# Patient Record
Sex: Female | Born: 1955
Health system: Southern US, Community
[De-identification: ages and names within clinical notes are randomized; demographics above are authoritative.]

## PROBLEM LIST (undated history)

## (undated) DIAGNOSIS — H269 Unspecified cataract: Secondary | ICD-10-CM

## (undated) DIAGNOSIS — J45909 Unspecified asthma, uncomplicated: Secondary | ICD-10-CM

## (undated) DIAGNOSIS — T7840XA Allergy, unspecified, initial encounter: Secondary | ICD-10-CM

## (undated) DIAGNOSIS — F32A Depression, unspecified: Secondary | ICD-10-CM

## (undated) DIAGNOSIS — F329 Major depressive disorder, single episode, unspecified: Secondary | ICD-10-CM

## (undated) HISTORY — PX: KNEE SURGERY: SHX244

## (undated) HISTORY — DX: Unspecified asthma, uncomplicated: J45.909

## (undated) HISTORY — DX: Unspecified cataract: H26.9

## (undated) HISTORY — DX: Allergy, unspecified, initial encounter: T78.40XA

## (undated) HISTORY — DX: Depression, unspecified: F32.A

## (undated) HISTORY — PX: CATARACT EXTRACTION: SUR2

## (undated) HISTORY — DX: Major depressive disorder, single episode, unspecified: F32.9

## (undated) HISTORY — PX: APPENDECTOMY: SHX54

---

## 1997-11-07 ENCOUNTER — Encounter: Admission: RE | Admit: 1997-11-07 | Discharge: 1998-02-05 | Payer: Self-pay | Admitting: Family Medicine

## 1998-06-28 ENCOUNTER — Other Ambulatory Visit: Admission: RE | Admit: 1998-06-28 | Discharge: 1998-06-28 | Payer: Self-pay | Admitting: Gynecology

## 1999-07-02 ENCOUNTER — Other Ambulatory Visit: Admission: RE | Admit: 1999-07-02 | Discharge: 1999-07-02 | Payer: Self-pay | Admitting: Gynecology

## 2000-06-21 ENCOUNTER — Other Ambulatory Visit: Admission: RE | Admit: 2000-06-21 | Discharge: 2000-06-21 | Payer: Self-pay | Admitting: Gynecology

## 2001-06-20 ENCOUNTER — Other Ambulatory Visit: Admission: RE | Admit: 2001-06-20 | Discharge: 2001-06-20 | Payer: Self-pay | Admitting: Gynecology

## 2002-06-22 ENCOUNTER — Other Ambulatory Visit: Admission: RE | Admit: 2002-06-22 | Discharge: 2002-06-22 | Payer: Self-pay | Admitting: Gynecology

## 2002-10-17 ENCOUNTER — Encounter: Admission: RE | Admit: 2002-10-17 | Discharge: 2002-10-17 | Payer: Self-pay | Admitting: Family Medicine

## 2002-10-17 ENCOUNTER — Encounter: Payer: Self-pay | Admitting: Family Medicine

## 2002-12-23 ENCOUNTER — Observation Stay (HOSPITAL_COMMUNITY): Admission: EM | Admit: 2002-12-23 | Discharge: 2002-12-24 | Payer: Self-pay | Admitting: Emergency Medicine

## 2002-12-23 ENCOUNTER — Encounter (INDEPENDENT_AMBULATORY_CARE_PROVIDER_SITE_OTHER): Payer: Self-pay | Admitting: Specialist

## 2003-06-25 ENCOUNTER — Ambulatory Visit (HOSPITAL_COMMUNITY): Admission: RE | Admit: 2003-06-25 | Discharge: 2003-06-25 | Payer: Self-pay | Admitting: Orthopedic Surgery

## 2003-09-11 ENCOUNTER — Other Ambulatory Visit: Admission: RE | Admit: 2003-09-11 | Discharge: 2003-09-11 | Payer: Self-pay | Admitting: Gynecology

## 2004-01-28 ENCOUNTER — Encounter: Admission: RE | Admit: 2004-01-28 | Discharge: 2004-01-28 | Payer: Self-pay | Admitting: Family Medicine

## 2004-07-29 ENCOUNTER — Encounter: Admission: RE | Admit: 2004-07-29 | Discharge: 2004-07-29 | Payer: Self-pay | Admitting: Family Medicine

## 2004-09-16 ENCOUNTER — Other Ambulatory Visit: Admission: RE | Admit: 2004-09-16 | Discharge: 2004-09-16 | Payer: Self-pay | Admitting: Gynecology

## 2005-04-29 ENCOUNTER — Ambulatory Visit (HOSPITAL_BASED_OUTPATIENT_CLINIC_OR_DEPARTMENT_OTHER): Admission: RE | Admit: 2005-04-29 | Discharge: 2005-04-29 | Payer: Self-pay | Admitting: Orthopedic Surgery

## 2005-09-17 ENCOUNTER — Other Ambulatory Visit: Admission: RE | Admit: 2005-09-17 | Discharge: 2005-09-17 | Payer: Self-pay | Admitting: Gynecology

## 2005-12-31 ENCOUNTER — Ambulatory Visit: Payer: Self-pay | Admitting: Internal Medicine

## 2006-01-05 ENCOUNTER — Ambulatory Visit (HOSPITAL_COMMUNITY): Admission: RE | Admit: 2006-01-05 | Discharge: 2006-01-05 | Payer: Self-pay | Admitting: Internal Medicine

## 2006-11-04 ENCOUNTER — Other Ambulatory Visit: Admission: RE | Admit: 2006-11-04 | Discharge: 2006-11-04 | Payer: Self-pay | Admitting: Gynecology

## 2006-12-08 ENCOUNTER — Ambulatory Visit: Payer: Self-pay | Admitting: Pulmonary Disease

## 2006-12-22 ENCOUNTER — Ambulatory Visit: Payer: Self-pay | Admitting: Internal Medicine

## 2007-11-08 ENCOUNTER — Other Ambulatory Visit: Admission: RE | Admit: 2007-11-08 | Discharge: 2007-11-08 | Payer: Self-pay | Admitting: Gynecology

## 2008-08-09 ENCOUNTER — Encounter: Admission: RE | Admit: 2008-08-09 | Discharge: 2008-08-09 | Payer: Self-pay | Admitting: Orthopedic Surgery

## 2008-08-23 ENCOUNTER — Encounter (INDEPENDENT_AMBULATORY_CARE_PROVIDER_SITE_OTHER): Payer: Self-pay | Admitting: Orthopedic Surgery

## 2008-08-23 ENCOUNTER — Ambulatory Visit: Admission: RE | Admit: 2008-08-23 | Discharge: 2008-08-23 | Payer: Self-pay | Admitting: Orthopedic Surgery

## 2008-08-23 ENCOUNTER — Ambulatory Visit: Payer: Self-pay | Admitting: Surgery

## 2010-07-01 NOTE — Assessment & Plan Note (Signed)
[facility]                             PULMONARY OFFICE NOTE   Cheryl Castillo, Cheryl Castillo                       MRN:          [id number]  DATE:12/22/2006                            DOB:          [birth year]    HISTORY OF PRESENT ILLNESS:  The patient is a 55 year old white female  patient of Dr. Roxy Cedar who has a known history of recurrent bronchitis  and allergic rhinitis.  Presents today for a 2 week followup.  Last  visit patient had been having difficulty with possible mold exposure.  Patient's house had been found to have extensive mold secondary to leaky  pipes.  Patient complains of recurrent headaches, nasal congestion,  postnasal drip symptoms and recurrent bronchitic exacerbations.  Last  visit patient was recommended to increase Flonase up to 2 puffs twice  daily, add-in Zyrtec at bedtime and along with a nasal hygiene regimen  with saline nasal spray.  Chest x-ray was done without any acute  findings noted.  Patient had been recommended to decrease her exposure  to her house if possible.  Patient returns today reporting that she has  moved out of her house and symptoms have substantially improved.  Cough  has totally resolved.  Nasal symptoms have decreased and her headache is  also resolved.  Patient does report though she has been back to her  house on 2 separate occasions and within a few minutes after returning  to the house her cough returns.  Patient reports she does feel much  better and overall feels much better.   PAST MEDICAL HISTORY:  Reviewed.   CURRENT MEDICATIONS:  Reviewed.   PHYSICAL EXAMINATION:  Patient is a pleasant female in no acute  distress.  She is afebrile with stable vital signs.  O2 saturation is  98% on room air.  HEENT:  Nasal mucosa is pale.  Nontender sinuses.  Posterior pharynx is  clear.  NECK:  Supple without cervical adenopathy.  LUNGS:  Sounds are clear without any wheezing or crackles.  CARDIAC:  Regular  rate and rhythm.  ABDOMEN:  Soft and nontender.  EXTREMITIES:  Warm without any edema.   IMPRESSION AND PLAN:  Recurrent bronchitis and rhinitis with recent  flare.  Symptoms are much improved with increased nasal regimen with  Flonase 2 puffs twice daily along with Zyrtec at bedtime.  Patient's  symptoms could also be secondary to chronic mold exposure in her home.  Patient is seeking alternative living situations presently and is  recommended to follow back up with our office as scheduled with Dr.  Maple Hudson in 6-8 weeks or sooner if needed.      Rubye Oaks, NP  Electronically Signed      Clinton D. Maple Hudson, MD, Tonny Bollman, FACP  Electronically Signed   TP/MedQ  DD: 12/24/2006  DT: 12/24/2006  Job #: [id number]

## 2010-07-01 NOTE — Assessment & Plan Note (Signed)
[facility]                             PULMONARY OFFICE NOTE   Cheryl, Castillo                       MRN:          [id number]  DATE:12/08/2006                            DOB:          1955-11-25    HISTORY OF PRESENT ILLNESS:  The patient is a very pleasant 55 year old  white female patient of Dr. Roxy Cedar who has a known history of recurrent  bronchitis and allergic rhinitis that presents today complaining that  she has been having difficulty over the last year since she built a new  house and was found to have pipe leaking and subsequent mold  development.  The patient has had extensive trouble trying to get the  mold cleaned out of her house and has noticed that she has been having  recurrent headaches, nasal congestion, postnasal drip symptoms, and  recurrent bronchitis.  The patient denies any purulent sputum, chest  pain, orthopnea, PND, or leg swelling.  The patient reports she has  episodes in which she has had her house completely cleaned of mold, and  symptoms did improve, however, noticed that over the last few months  that symptoms have seemed to return and had her house checked once  again, and the mold has redeveloped.  Since then, her symptoms have been  much worse.   PAST MEDICAL HISTORY:  Reviewed.   CURRENT MEDICATIONS:  Reviewed.   PHYSICAL EXAMINATION:  The patient is a pleasant female patient in no  acute distress.  She is afebrile with stable vital signs.  O2 saturation is 97% on room  air.  HEENT:  Nasal mucosa is pale, nontender sinuses. TMs are normal.  NECK:  Supple without cervical adenopathy.  No JVD.  LUNG SOUNDS:  Coarse breath sounds without any wheezing or crackles.  CARDIAC:  Regular rate.  ABDOMEN:  Soft and nontender.  EXTREMITIES:  Warm without any edema.   IMPRESSION AND PLAN:  Recurrent bronchitis and rhinitis with recent  flare, possibly secondary to chronic mold exposure.  The patient has  definitely noticed a difference in the relation to her exposure to mold  with symptomatology.  Have suggested that the patient may benefit from  alternative living arrangement until her house is completely rid of all  mold.  I recommend that she maximize her present therapy with Flonase  nasal spray 2 puffs twice daily  plus saline nasal spray.  Will add in Zyrtec 10 mg at bedtime, and she  will follow back up with Dr. Maple Hudson in 2 weeks or sooner if needed.  Chest x-ray is pending at time of dictation.      Rubye Oaks, NP  Electronically Signed      Clinton D. Maple Hudson, MD, Tonny Bollman, FACP  Electronically Signed   TP/MedQ  DD: 12/09/2006  DT: 12/10/2006  Job #: [id number]

## 2010-07-04 NOTE — Op Note (Signed)
NAMEZANITA, MILLMAN                ACCOUNT NO.:  [id number]   MEDICAL RECORD NO.:  [id number]          PATIENT TYPE:  AMB   LOCATION:  DSC                          FACILITY:  [facility]   PHYSICIAN:  Feliberto Gottron. Turner Daniels, M.D.   DATE OF BIRTH:  1955-09-16   DATE OF PROCEDURE:  04/29/2005  DATE OF DISCHARGE:  04/29/2005                                 OPERATIVE REPORT   PREOPERATIVE DIAGNOSES:  Left knee medial meniscal tear.   POSTOPERATIVE DIAGNOSES:  Left knee medial meniscal tear.  Chondromalacia of  medial femoral condyle and patella.   OPERATION PERFORMED:  Left knee partial arthroscopic medial meniscectomy for  a large parrot beak tear as well as debridement of chondromalacia with flap  tears on the medial femoral condyle and the patella.   SURGEON:  Feliberto Gottron. Turner Daniels, M.D.   ASSISTANT:  None.   ANESTHESIA:  General LMA.   ESTIMATED BLOOD LOSS:  Minimal.   FLUIDS REPLACED:  700 mL crystalloids.   DRAINS:  None.   TOURNIQUET TIME:  None.   INDICATIONS FOR PROCEDURE:  The patient is a 55 year old woman with  significant recurrent intermittent catching, popping and pain to the medial  aspect of her left knee.  She has failed conservative treatment with  observation and anti-inflammatory medicines and has had locking episodes  lasting up to 15 minutes that totally prevented her from walking.  Because  of this she is a candidate for arthroscopic evaluation and treatment of her  left knee.  A preoperative MRI scan was accomplished on April 23, 2005  showing a large complex tear of the posterior horn of the medial meniscus at  least 2 cm in length.  The risks and benefits of surgery were discussed and  she was prepared for surgical intervention.   DESCRIPTION OF PROCEDURE:  The patient was identified by arm band and taken  to the operating room at [facility] where the appropriate  anesthetic monitors were attached.  General LMA anesthesia was induced with  the patient in  the supine position.  Lateral post applied to the table and  the left lower extremity prepped and draped in the usual sterile fashion  from the ankle to the midthigh.  Using a #11 blade, standard inferomedial  and inferolateral peripatellar portals were made allowing of the arthroscope  through the inferolateral portal and outflow through the inferomedial  portal.  Diagnostic arthroscopy revealed some chondromalacia of the apex of  the patella which was debrided back to a stable margin with a 3.5 Gator  sucker shaver as well as the medial femoral condyle with grade 3  chondromalacia and flap tears again debrided back to a stable margin. A  large posterior parrot beak tear of the medial meniscus was identified.  It  was pulled forward and then resected with a 4.2 [place] sucker shaver  and a 3.5 Gator sucker shaver.  We then probed the meniscal remnant, most of  the posterior horn was now gone and it was felt to be stable.  At this point  the ACL and PCL were  visualized and found to be stable as was the lateral  meniscus.  The gutters were cleared medially and laterally.  The knee was  irrigated out with normal saline solution.  The arthroscopic instruments  were removed and a dressing of Xeroform, 4 x 4 dressing sponges, Webril and  Ace wrap was applied.  The patient was awakened and taken to the recovery  room without difficulty.      Feliberto Gottron. Turner Daniels, M.D.  Electronically Signed     FJR/MEDQ  D:  06/10/2005  T:  06/11/2005  Job:  [id number]

## 2010-07-04 NOTE — H&P (Signed)
NAME:  Cheryl Castillo, OPFER NO.:  [id number]   MEDICAL RECORD NO.:  [id number]                   PATIENT TYPE:  EMS   LOCATION:  ED                                   FACILITY:  [facility]   PHYSICIAN:  Lorre Munroe., M.D.            DATE OF BIRTH:  July 31, 1955   DATE OF ADMISSION:  12/23/2002  DATE OF DISCHARGE:                                HISTORY & PHYSICAL   CHIEF COMPLAINT:  Abdominal pain.   PRESENT ILLNESS:  The patient is a 55 year old white female who has had a  several day history of abdominal upset and generalized discomfort which has  changes into rather severe pain in the right lower quadrant.  She has not  had a fever or loss of appetite.  She has not had diarrhea or vomiting.  She  was seen at the emergency department where some tenderness of the abdomen  was noted.  White count was found to be slightly elevated and a CT scan of  the abdomen was performed which was consistent with acute appendicitis.  The  patient is admitted to the hospital to have an appendectomy.   PAST MEDICAL HISTORY:  1. She has gastroesophageal reflux disease.  2. She has a history of mood swings and is on Effexor, Wellbutrin, birth     control pills and acid reducing medicines.  3. She has a chronic cough.  4. She was recently treated with an antibiotic for an upper respiratory     infection.  5. She denies medication allergies.  6. She is not pregnant.  7. She has had a laparoscopy for endometriosis in an attempt to increase     fertility.  No other operations.   FAMILY HISTORY:  Unremarkable.   CHILDHOOD ILLNESSES:  Unremarkable.   She says she does not smoke or drink.   REVIEW OF SYSTEMS:  Unremarkable in detail except for what is mentioned  above.   PHYSICAL EXAMINATION:  VITAL SIGNS:  Temperature and vital signs per nursing  staff are unremarkable.  MENTAL STATUS:  Normal.  GENERAL:  She appears healthy and in no acute distress.  HEAD/NECK/EYES/EARS/NOSE/MOUTH AND THROAT:  Unremarkable.  CHEST:  Clear to auscultation.  HEART:  Rate and rhythm normal.  No murmur or gallop.  BREASTS:  Normal.  ABDOMEN:  Soft but quite tender in the right lower quadrant.  Bowel sounds  are present.  No masses are noted.  RECTAL:  Not done.  PELVIC:  Not done.  EXTREMITIES:  No edema.  Pulses good.  SKIN:  No lesions noted.  LYMPH NODES:  None enlarged.  NEUROLOGIC:  Normal.   IMPRESSION:  Acute appendicitis.   PLAN:  Laparoscopic appendectomy.  The patient agrees to this with the risk  of having anesthesia, possible bleeding, possible postoperative infection  and the slight possibility that the diagnosis is incorrect.  We will proceed  with appendectomy tonight.  Lorre Munroe., M.D.    WB/MEDQ  D:  12/23/2002  T:  12/23/2002  Job:  [id number]

## 2010-07-04 NOTE — Assessment & Plan Note (Signed)
[facility]                               PULMONARY OFFICE NOTE   Cheryl Castillo, Cheryl Castillo                       MRN:          [id number]  DATE:12/31/2005                            DOB:          June 11, 1955    PROBLEMS:  1. Bronchitis.  2. Rhinitis.  3. Possible reflux or aspiration.   HISTORY:  This is a never smoker previously followed at [facility] where she was last seen in 2003. She had a pattern of recurrent  bronchitis with colds primarily. Pulmonary function tests had been normal in  2002. She had been treated for acute bronchitis and for allergic rhinitis.  She says over the last few years she has continued to have episodes of  rhinitis and bronchitis symptoms. She has no pets. They live in a brand new  house that is about 57 years old now for them with a high grade air purifying  system. She does notice on questioning that symptoms cleared dramatically  when they went to [place] for vacation, but seemed to have gradually  returned after she had been back here for a while. She says she is coughing  all night, every night. She had evaluation by Dr. Narda Bonds and by an  allergist, with no help on many medications, saying that she has lots of  samples she has tried. A nettypot helps with saline lavage. She says she  evaluated her own problems by searching the Internet and thinks the muscles  in her throat are not working right. She says she frequently notices some  aspiration and choking with solids and liquids. While supine, she says  because of post-nasal, she will swallow a lot of air and often her head is  congested at night. Antibiotics repeatedly help. Levaquin caused joints to  ache. An albuterol inhaler helps. She describes a sub-sternal burning  sensation as being the source of her cough. She is using a Breathe-Right  strip almost every night.   MEDICATIONS:  1. Effexor 75 mg.  2. Wellbutrin.  3. Protonix.  4.  Albuterol inhaler.   ALLERGIES:  None. Except LEVAQUIN caused arthralgias.   OBJECTIVE:  Weight 172 pounds, blood pressure 108/60, pulse regular 84, room  air saturation 100%. There is some white mucoid post-nasal drainage. Maybe  minimal nasal stuffiness. No conjunctival injection. Pharynx does not look  reddened. There is no strider. Lungs were clear. Heart sounds regular  without murmur.   IMPRESSION:  1. Response to albuterol and to antibiotics suggest a bronchitis or      sinusitis component.  2. Her awareness that cough is triggered by eating and drinking suggests a      reflux or aspiration component.  3. Improvement when she went out of town suggests an environmental factor,      probably allergy.   PLAN:  1. Retry sample Astelin once each nostril at least nightly P.R.N.  2. Modified barium swallow, speech therapy assisted.  3. She made need a routine barium swallow to assess for reflux.  4. Try Symbicort 160/4.5 2 puffs b.i.d. rinsing mouth.  5. Schedule return one month, earlier P.R.N.     Clinton D. Maple Hudson, MD, Tonny Bollman, FACP  Electronically Signed    CDY/MedQ  DD: 12/31/2005  DT: 01/01/2006  Job #: [id number]   cc:   Talmadge Coventry, M.D.

## 2010-07-04 NOTE — Op Note (Signed)
   NAME:  Cheryl Castillo, Cheryl Castillo NO.:  [id number]   MEDICAL RECORD NO.:  [id number]                   PATIENT TYPE:  INP   LOCATION:  0101                                 FACILITY:  [facility]   PHYSICIAN:  Lorre Munroe., M.D.            DATE OF BIRTH:  December 07, 1955   DATE OF PROCEDURE:  12/23/2002  DATE OF DISCHARGE:                                 OPERATIVE REPORT   PREOPERATIVE DIAGNOSIS:  Acute appendicitis.   POSTOPERATIVE DIAGNOSIS:  Acute appendicitis.   OPERATION:  Laparoscopic appendectomy.   SURGEON:  Zigmund Daniel, M.D.   ANESTHESIA:  General.   DESCRIPTION OF PROCEDURE:  After adequate monitoring and anesthesia and  routine preparation and draping of the abdomen and placement of a Foley  catheter, I infused local anesthetic at the site of her previous laparoscopy  just below the umbilicus.  I subsequently anesthetized two additional sites  for other ports.  I made a transverse skin incision through an old  laparoscopy site and dissected down to the midline fascia, opened it in the  midline and bluntly entered the peritoneal cavity.  I dilated the tract with  my finger and felt around to be sure there were no adhesions of bowel to the  umbilicus.  I then used a 0 Vicryl pursestring suture in the fascia to  secure a Hasson cannula and inflated the abdomen with CO2.  I put in the  camera and then put in a 5 mm right upper quadrant port, retracted the cecum  up and saw an inflamed appendix.  I put in an 11 mm left lower quadrant  port, elevated the appendix, took down adhesions, and then dissected in the  mesentery until I saw the appendiceal artery.  I clipped it with clips and  divided it, and hemostasis was good.  I then amputated the appendix at its  entry into the cecum with the cutting stapler.  Hemostasis was excellent.  There was no free fluid.  There was no evidence of any  other inflammatory  disease in the pelvis.  I placed the  appendix in a plastic pouch and removed  it through the umbilical incision and tied the pursestring suture.  I  removed the right upper quadrant under direct vision and then removed the  left lower quadrant port after allowing the CO2 to escape.  The sponge,  needle, and instrument counts were correct were correct.  The patient  tolerated the operation well.                                               Lorre Munroe., M.D.    WB/MEDQ  D:  12/23/2002  T:  12/24/2002  Job:  [id number]

## 2010-07-29 ENCOUNTER — Other Ambulatory Visit: Payer: Self-pay | Admitting: Gastroenterology

## 2010-07-29 DIAGNOSIS — R1011 Right upper quadrant pain: Secondary | ICD-10-CM

## 2010-07-30 ENCOUNTER — Ambulatory Visit
Admission: RE | Admit: 2010-07-30 | Discharge: 2010-07-30 | Disposition: A | Discharge status: Home / Self Care | Payer: Commercial Managed Care - PPO | Source: Ambulatory Visit | Attending: Gastroenterology | Admitting: Gastroenterology

## 2010-07-30 DIAGNOSIS — R1011 Right upper quadrant pain: Secondary | ICD-10-CM

## 2011-01-14 ENCOUNTER — Telehealth: Payer: Self-pay | Admitting: Internal Medicine

## 2011-01-14 NOTE — Telephone Encounter (Signed)
Pt notified of CDY recs and says she will contact her PCP if she does not continue to improve. Pt verbalized understanding.

## 2011-01-14 NOTE — Telephone Encounter (Signed)
It can take a couple of weeks to recover from pneumonia, but that depends on lot of things. We can re-establish her here if she wants, to be available, but otherwise she should talk to her primary MD.

## 2011-01-14 NOTE — Telephone Encounter (Signed)
Called and spoke with pt.  Pt states she was recently dx with PNA 10 days ago at [facility].  States she was prescribed Ventolin and Omnicef 300mg  bid which she is taking.  Pt states she is in bed and resting.  Denies f/c/s but states she is very weak, no energy, tightness in chest, coughing up small amounts of yellow sputum.  Pt just wants  to know if this is the normal course for recovery from PNA, how long will this last, and is there anything she can do to expedite the recovery. FYI:  Pt hasn't seen CY since 12/2006!!!!!

## 2011-01-21 ENCOUNTER — Ambulatory Visit (INDEPENDENT_AMBULATORY_CARE_PROVIDER_SITE_OTHER): Payer: Commercial Managed Care - PPO

## 2011-01-21 DIAGNOSIS — J984 Other disorders of lung: Secondary | ICD-10-CM

## 2011-01-21 DIAGNOSIS — J157 Pneumonia due to Mycoplasma pneumoniae: Secondary | ICD-10-CM

## 2011-02-05 ENCOUNTER — Ambulatory Visit (INDEPENDENT_AMBULATORY_CARE_PROVIDER_SITE_OTHER): Payer: Commercial Managed Care - PPO

## 2011-02-05 DIAGNOSIS — J159 Unspecified bacterial pneumonia: Secondary | ICD-10-CM

## 2011-02-05 DIAGNOSIS — R05 Cough: Secondary | ICD-10-CM

## 2011-02-05 DIAGNOSIS — R059 Cough, unspecified: Secondary | ICD-10-CM

## 2011-12-03 ENCOUNTER — Ambulatory Visit (INDEPENDENT_AMBULATORY_CARE_PROVIDER_SITE_OTHER): Payer: Commercial Managed Care - PPO | Admitting: Family Medicine

## 2011-12-03 ENCOUNTER — Encounter: Payer: Self-pay | Admitting: Family Medicine

## 2011-12-03 VITALS — BP 110/64 | HR 61 | Temp 98.1°F | Resp 16 | Ht 63.0 in | Wt 150.2 lb

## 2011-12-03 DIAGNOSIS — Z8659 Personal history of other mental and behavioral disorders: Secondary | ICD-10-CM | POA: Insufficient documentation

## 2011-12-03 DIAGNOSIS — Z23 Encounter for immunization: Secondary | ICD-10-CM

## 2011-12-03 DIAGNOSIS — Z13220 Encounter for screening for lipoid disorders: Secondary | ICD-10-CM

## 2011-12-03 DIAGNOSIS — Z8371 Family history of colonic polyps: Secondary | ICD-10-CM | POA: Insufficient documentation

## 2011-12-03 DIAGNOSIS — J45909 Unspecified asthma, uncomplicated: Secondary | ICD-10-CM

## 2011-12-03 DIAGNOSIS — K219 Gastro-esophageal reflux disease without esophagitis: Secondary | ICD-10-CM | POA: Insufficient documentation

## 2011-12-03 DIAGNOSIS — Z8701 Personal history of pneumonia (recurrent): Secondary | ICD-10-CM | POA: Insufficient documentation

## 2011-12-03 DIAGNOSIS — Z8719 Personal history of other diseases of the digestive system: Secondary | ICD-10-CM | POA: Insufficient documentation

## 2011-12-03 DIAGNOSIS — G4733 Obstructive sleep apnea (adult) (pediatric): Secondary | ICD-10-CM | POA: Insufficient documentation

## 2011-12-03 DIAGNOSIS — Z0000 Encounter for general adult medical examination without abnormal findings: Secondary | ICD-10-CM

## 2011-12-03 LAB — POCT URINALYSIS DIPSTICK
Bilirubin, UA: NEGATIVE
Glucose, UA: NEGATIVE
Ketones, UA: NEGATIVE
Leukocytes, UA: NEGATIVE
Nitrite, UA: NEGATIVE
Protein, UA: NEGATIVE
Spec Grav, UA: 1.015
Urobilinogen, UA: 0.2
pH, UA: 7.5

## 2011-12-03 LAB — CBC WITH DIFFERENTIAL/PLATELET
Basophils Absolute: 0 10*3/uL (ref 0.0–0.1)
Basophils Relative: 1 % (ref 0–1)
Eosinophils Absolute: 0.1 10*3/uL (ref 0.0–0.7)
Eosinophils Relative: 2 % (ref 0–5)
HCT: 37.4 % (ref 36.0–46.0)
Hemoglobin: 13.2 g/dL (ref 12.0–15.0)
Lymphocytes Relative: 35 % (ref 12–46)
Lymphs Abs: 1.9 10*3/uL (ref 0.7–4.0)
MCH: 29.6 pg (ref 26.0–34.0)
MCHC: 35.3 g/dL (ref 30.0–36.0)
MCV: 83.9 fL (ref 78.0–100.0)
Monocytes Absolute: 0.4 10*3/uL (ref 0.1–1.0)
Monocytes Relative: 7 % (ref 3–12)
Neutro Abs: 3.1 10*3/uL (ref 1.7–7.7)
Neutrophils Relative %: 55 % (ref 43–77)
Platelets: 307 10*3/uL (ref 150–400)
RBC: 4.46 MIL/uL (ref 3.87–5.11)
RDW: 13.2 % (ref 11.5–15.5)
WBC: 5.6 10*3/uL (ref 4.0–10.5)

## 2011-12-03 LAB — COMPREHENSIVE METABOLIC PANEL
ALT: 21 U/L (ref 0–35)
AST: 18 U/L (ref 0–37)
Albumin: 4.6 g/dL (ref 3.5–5.2)
Alkaline Phosphatase: 62 U/L (ref 39–117)
BUN: 16 mg/dL (ref 6–23)
CO2: 30 mEq/L (ref 19–32)
Calcium: 9.9 mg/dL (ref 8.4–10.5)
Chloride: 105 mEq/L (ref 96–112)
Creat: 0.84 mg/dL (ref 0.50–1.10)
Glucose, Bld: 88 mg/dL (ref 70–99)
Potassium: 4.3 mEq/L (ref 3.5–5.3)
Sodium: 142 mEq/L (ref 135–145)
Total Bilirubin: 0.6 mg/dL (ref 0.3–1.2)
Total Protein: 6.9 g/dL (ref 6.0–8.3)

## 2011-12-03 LAB — LIPID PANEL
Cholesterol: 194 mg/dL (ref 0–200)
HDL: 58 mg/dL (ref >39)
LDL Cholesterol: 123 mg/dL — ABNORMAL HIGH (ref 0–99)
Total CHOL/HDL Ratio: 3.3 Ratio
Triglycerides: 66 mg/dL (ref <150)
VLDL: 13 mg/dL (ref 0–40)

## 2011-12-03 MED ORDER — MOMETASONE FUROATE 220 MCG/INH IN AEPB: 2.0000 | INHALATION_SPRAY | Freq: Every day | RESPIRATORY_TRACT | Status: DC

## 2011-12-03 MED ORDER — ALBUTEROL SULFATE HFA 108 (90 BASE) MCG/ACT IN AERS: 2.0000 | INHALATION_SPRAY | Freq: Four times a day (QID) | RESPIRATORY_TRACT | Status: DC | PRN

## 2011-12-03 NOTE — Patient Instructions (Signed)
Asthma, Adult Asthma is caused by narrowing of the air passages in the lungs. It may be triggered by pollen, dust, animal dander, molds, some foods, respiratory infections, exposure to smoke, exercise, emotional stress or other allergens (things that cause allergic reactions or allergies). Repeat attacks are common. HOME CARE INSTRUCTIONS   Use prescription medications as ordered by your caregiver.  Avoid pollen, dust, animal dander, molds, smoke and other things that cause attacks at home and at work.  You may have fewer attacks if you decrease dust in your home. Electrostatic air cleaners may help.  It may help to replace your pillows or mattress with materials less likely to cause allergies.  Talk to your caregiver about an action plan for managing asthma attacks at home, including, the use of a peak flow meter which measures the severity of your asthma attack. An action plan can help minimize or stop the attack without having to seek medical care.  If you are not on a fluid restriction, drink 8 to 10 glasses of water each day.  Always have a plan prepared for seeking medical attention, including, calling your physician, accessing local emergency care, and calling 911 (in the [place] for a severe attack.  Discuss possible exercise routines with your caregiver.  If animal dander is the cause of asthma, you may need to get rid of pets. SEEK MEDICAL CARE IF:   You have wheezing and shortness of breath even if taking medicine to prevent attacks.  You have muscle aches, chest pain or thickening of sputum.  Your sputum changes from clear or white to yellow, green, gray, or bloody.  You have any problems that may be related to the medicine you are taking (such as a rash, itching, swelling or trouble breathing). SEEK IMMEDIATE MEDICAL CARE IF:   Your usual medicines do not stop your wheezing or there is increased coughing and/or shortness of breath.  You have increased difficulty  breathing.  You have a fever. MAKE SURE YOU:   Understand these instructions.  Will watch your condition.  Will get help right away if you are not doing well or get worse. Document Released: 02/02/2005 Document Revised: 04/27/2011 Document Reviewed: 09/21/2007 [facility] Patient Information 2013 [place], [place].   Allergic Rhinitis Allergic rhinitis is when the mucous membranes in the nose respond to allergens. Allergens are particles in the air that cause your body to have an allergic reaction. This causes you to release allergic antibodies. Through a chain of events, these eventually cause you to release histamine into the blood stream (hence the use of antihistamines). Although meant to be protective to the body, it is this release that causes your discomfort, such as frequent sneezing, congestion and an itchy runny nose.  CAUSES  The pollen allergens may come from grasses, trees, and weeds. This is seasonal allergic rhinitis, or "hay fever." Other allergens cause year-round allergic rhinitis (perennial allergic rhinitis) such as house dust mite allergen, pet dander and mold spores.  SYMPTOMS   Nasal stuffiness (congestion).  Runny, itchy nose with sneezing and tearing of the eyes.  There is often an itching of the mouth, eyes and ears. It cannot be cured, but it can be controlled with medications. DIAGNOSIS  If you are unable to determine the offending allergen, skin or blood testing may find it. TREATMENT   Avoid the allergen.  Medications and allergy shots (immunotherapy) can help.  Hay fever may often be treated with antihistamines in pill or nasal spray forms. Antihistamines block the effects  of histamine. There are over-the-counter medicines that may help with nasal congestion and swelling around the eyes. Check with your caregiver before taking or giving this medicine. If the treatment above does not work, there are many new medications your caregiver can prescribe.  Stronger medications may be used if initial measures are ineffective. Desensitizing injections can be used if medications and avoidance fails. Desensitization is when a patient is given ongoing shots until the body becomes less sensitive to the allergen. Make sure you follow up with your caregiver if problems continue. SEEK MEDICAL CARE IF:   You develop fever (more than 100.5 F (38.1 C).  You develop a cough that does not stop easily (persistent).  You have shortness of breath.  You start wheezing.  Symptoms interfere with normal daily activities. Document Released: 10/28/2000 Document Revised: 04/27/2011 Document Reviewed: 05/09/2008 [facility] Patient Information 2013 [place], [place].    You have two inhalers to help reduce or eliminate your Asthma symptoms. At the first signs of tightness, cough or shortness of breath, use the Albuterol as needed ;when you feel that the Albuterol is not working by itself, add Asmanex and use it daily until respiratory symptoms disappear.

## 2011-12-03 NOTE — Progress Notes (Signed)
Subjective:    Patient ID: Cheryl Castillo, female    DOB: September 19, 1955, 56 y.o.   MRN: [id number]  HPI   This 56 y.o. Cauc female is here for CPE (PAP scheduled for November with Dr. Chevis Pretty). She has a hx of Allergic Asthma and Rhinitis, most strongly linked to mold discovered in her home 4-5 years ago. Her son also has similar allergy symptoms. She experiences mild nonprod cough And tightness in chest with burning in throat. She has several MDIs (Albuterol, Symbicort, Advair and Asmanex) but not sure when to use what. She seems to have most symptoms in the fall; she is a cyclist and exercises outdoors with no problems in spring, summer and winter. Pt reports having an evaluation by Dr. Mia Creek in 2009 with serum tests confirming mold allergy. Skin testing by an Allergist was negative. She had pneumonia last fall and was supposed to return for CXR in January or February but did not.  Pt attends OA ([employer]) which has helped her lose and keep off 30 lbs. 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  Her husband is a Risk analyst who is retired.  Last DEXA: 03/28/2009 (low probability of major fracture) Blood Type: A+ Lab results from testing done by Dr. Ananias Pilgrim: Elevated IgG concentrations for Penicillium notatum                                                                            and  Aspergillus flavus but modified RAST assay -Negative   Review of Systems  Constitutional: Positive for fatigue.  HENT: Negative.   Eyes: Negative.   Respiratory: Positive for apnea.   Cardiovascular: Negative.   Genitourinary: Negative.   Musculoskeletal: Negative.   Skin: Negative.   Neurological: Negative.   Hematological: Negative.   Psychiatric/Behavioral: Negative.        Objective:   Physical Exam  Nursing note and vitals reviewed. Constitutional: She is oriented to person, place, and time. She appears well-developed and well-nourished. No  distress.  HENT:  Head: Normocephalic and atraumatic.  Right Ear: Hearing, tympanic membrane, external ear and ear canal normal.  Left Ear: Hearing, tympanic membrane, external ear and ear canal normal.  Nose: Nose normal. No mucosal edema, nasal deformity or septal deviation. Right sinus exhibits no maxillary sinus tenderness and no frontal sinus tenderness. Left sinus exhibits no maxillary sinus tenderness and no frontal sinus tenderness.  Mouth/Throat: Uvula is midline and mucous membranes are normal. No oral lesions. Normal dentition. No dental caries. Posterior oropharyngeal erythema present. No oropharyngeal exudate.  Eyes: Conjunctivae normal, EOM and lids are normal. Pupils are equal, round, and reactive to light. No scleral icterus.  Fundoscopic exam:      The right eye shows no arteriolar narrowing and no papilledema. The right eye shows red reflex.      The left eye shows no arteriolar narrowing and no papilledema. The left eye shows red reflex. Neck: Normal range of motion. Neck supple. No thyromegaly present.  Cardiovascular: Normal rate, regular rhythm, normal heart sounds and intact distal pulses.  Exam reveals no gallop and no friction rub.   No murmur heard. Pulmonary/Chest: Effort normal and breath sounds normal. No respiratory distress.  She has no wheezes.  Abdominal: Soft. She exhibits no distension, no abdominal bruit, no pulsatile midline mass and no mass. Bowel sounds are decreased. There is no hepatosplenomegaly. There is no tenderness. There is no rebound, no guarding and no CVA tenderness. No hernia.  Genitourinary:       Deferred to GYN  Musculoskeletal: Normal range of motion. She exhibits no edema and no tenderness.  Lymphadenopathy:    She has no cervical adenopathy.  Neurological: She is alert and oriented to person, place, and time. She has normal reflexes. No cranial nerve deficit. She exhibits normal muscle tone. Coordination normal.  Skin: Skin is warm and  dry.       Multiple hyperpigmented areas on trunk esp. back area (no suspicious lesions)  Psychiatric: She has a normal mood and affect. Her behavior is normal. Judgment and thought content normal.    Spirometry (in office): normal.     Assessment & Plan:   1. Routine general medical examination at a health care facility  Comprehensive metabolic panel Spirometry with graph  2. Allergic rhinitis with asthma without status asthmaticus - per pt hx, this is only a problem during seasonal change in the fall.  Pt denies problems with routine exercise. CBC with Differential, Spirometry with graph Medication use reviewed: When Respiratory symptoms occur, use Albuterol MDI prn; if symptoms persists, add Asmanex and use daily (during fall season when usually symptomatic)  3. Screening for hyperlipidemia  Lipid panel  4. Need for prophylactic vaccination and inoculation against influenza  Flu vaccine greater than or equal to 3yo preservative free IM  5. Need for prophylactic vaccination against Streptococcus pneumoniae (pneumococcus)  Pneumococcal polysaccharide vaccine 23-valent greater than or equal to 2yo subcutaneous/IM

## 2011-12-04 ENCOUNTER — Encounter: Payer: Self-pay | Admitting: Family Medicine

## 2011-12-04 NOTE — Progress Notes (Signed)
Quick Note:  Please advise pt that the following labs are normal... Lipids are normal but LDL ("bad") cholesterol is above 100; with continued Healthy nutrition and regular exercise, this should come down over time. Complete blood counts are normal. Metabolic panel (which includes blood sugar, kidney and liver function tests and electrolytes) are all normal .  Continued good health to you!  Copy to pt. ______

## 2011-12-09 ENCOUNTER — Encounter: Payer: Self-pay | Admitting: Family Medicine

## 2012-02-01 ENCOUNTER — Other Ambulatory Visit: Payer: Self-pay | Admitting: Family Medicine

## 2012-02-01 ENCOUNTER — Other Ambulatory Visit: Payer: Self-pay | Admitting: Radiology

## 2012-04-07 ENCOUNTER — Ambulatory Visit: Payer: 59 | Admitting: Family Medicine

## 2012-11-25 ENCOUNTER — Ambulatory Visit (INDEPENDENT_AMBULATORY_CARE_PROVIDER_SITE_OTHER): Payer: BC Managed Care – PPO | Admitting: *Deleted

## 2012-11-25 DIAGNOSIS — Z23 Encounter for immunization: Secondary | ICD-10-CM

## 2013-05-08 ENCOUNTER — Ambulatory Visit (INDEPENDENT_AMBULATORY_CARE_PROVIDER_SITE_OTHER): Payer: BC Managed Care – PPO | Admitting: Internal Medicine

## 2013-05-08 VITALS — BP 101/54 | HR 67 | Temp 98.4°F | Resp 18 | Wt 160.0 lb

## 2013-05-08 DIAGNOSIS — Z780 Asymptomatic menopausal state: Secondary | ICD-10-CM | POA: Insufficient documentation

## 2013-05-08 DIAGNOSIS — R454 Irritability and anger: Secondary | ICD-10-CM

## 2013-05-08 DIAGNOSIS — M899 Disorder of bone, unspecified: Secondary | ICD-10-CM

## 2013-05-08 DIAGNOSIS — M8580 Other specified disorders of bone density and structure, unspecified site: Secondary | ICD-10-CM | POA: Insufficient documentation

## 2013-05-08 DIAGNOSIS — N951 Menopausal and female climacteric states: Secondary | ICD-10-CM

## 2013-05-08 DIAGNOSIS — G4733 Obstructive sleep apnea (adult) (pediatric): Secondary | ICD-10-CM

## 2013-05-08 DIAGNOSIS — M949 Disorder of cartilage, unspecified: Secondary | ICD-10-CM
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
## 2013-05-09 NOTE — Patient Instructions (Signed)
See me as needed 

## 2013-06-06 ENCOUNTER — Telehealth: Payer: Self-pay | Admitting: *Deleted

## 2013-06-06 NOTE — Telephone Encounter (Signed)
Pt had a free thyroid scan that was abnormal will send scan to office and would like to know if Dr Coralyn Mark wants to see her or if she will need a referral

## 2013-06-15 ENCOUNTER — Ambulatory Visit: Payer: Commercial Managed Care - PPO | Admitting: Internal Medicine

## 2013-07-04 ENCOUNTER — Ambulatory Visit (HOSPITAL_BASED_OUTPATIENT_CLINIC_OR_DEPARTMENT_OTHER)
Admission: RE | Admit: 2013-07-04 | Discharge: 2013-07-04 | Disposition: A | Discharge status: Home / Self Care | Payer: BC Managed Care – PPO | Source: Ambulatory Visit | Attending: Internal Medicine | Admitting: Internal Medicine

## 2013-07-04 ENCOUNTER — Encounter: Payer: Self-pay | Admitting: Internal Medicine

## 2013-07-04 ENCOUNTER — Ambulatory Visit (INDEPENDENT_AMBULATORY_CARE_PROVIDER_SITE_OTHER): Payer: BC Managed Care – PPO | Admitting: Internal Medicine

## 2013-07-04 VITALS — BP 93/60 | HR 61 | Temp 98.0°F | Resp 18 | Ht 63.5 in | Wt 158.0 lb

## 2013-07-04 DIAGNOSIS — R946 Abnormal results of thyroid function studies: Secondary | ICD-10-CM

## 2013-07-04 DIAGNOSIS — R9389 Abnormal findings on diagnostic imaging of other specified body structures: Secondary | ICD-10-CM

## 2013-07-04 DIAGNOSIS — Z139 Encounter for screening, unspecified: Secondary | ICD-10-CM

## 2013-07-04 DIAGNOSIS — Z13220 Encounter for screening for lipoid disorders: Secondary | ICD-10-CM

## 2013-07-04 LAB — CBC WITH DIFFERENTIAL/PLATELET
Basophils Absolute: 0.1 10*3/uL (ref 0.0–0.1)
Basophils Relative: 1 % (ref 0–1)
Eosinophils Absolute: 0.2 10*3/uL (ref 0.0–0.7)
Eosinophils Relative: 3 % (ref 0–5)
HCT: 36.3 % (ref 36.0–46.0)
Hemoglobin: 13 g/dL (ref 12.0–15.0)
Lymphocytes Relative: 39 % (ref 12–46)
Lymphs Abs: 2 10*3/uL (ref 0.7–4.0)
MCH: 30.5 pg (ref 26.0–34.0)
MCHC: 35.8 g/dL (ref 30.0–36.0)
MCV: 85.2 fL (ref 78.0–100.0)
Monocytes Absolute: 0.3 10*3/uL (ref 0.1–1.0)
Monocytes Relative: 6 % (ref 3–12)
Neutro Abs: 2.6 10*3/uL (ref 1.7–7.7)
Neutrophils Relative %: 51 % (ref 43–77)
Platelets: 306 10*3/uL (ref 150–400)
RBC: 4.26 MIL/uL (ref 3.87–5.11)
RDW: 13.1 % (ref 11.5–15.5)
WBC: 5 10*3/uL (ref 4.0–10.5)

## 2013-07-04 LAB — COMPREHENSIVE METABOLIC PANEL
ALT: 18 U/L (ref 0–35)
AST: 20 U/L (ref 0–37)
Albumin: 4.2 g/dL (ref 3.5–5.2)
Alkaline Phosphatase: 49 U/L (ref 39–117)
BUN: 18 mg/dL (ref 6–23)
CO2: 30 mEq/L (ref 19–32)
Calcium: 9.4 mg/dL (ref 8.4–10.5)
Chloride: 107 mEq/L (ref 96–112)
Creat: 0.8 mg/dL (ref 0.50–1.10)
Glucose, Bld: 98 mg/dL (ref 70–99)
Potassium: 4.2 mEq/L (ref 3.5–5.3)
Sodium: 141 mEq/L (ref 135–145)
Total Bilirubin: 0.4 mg/dL (ref 0.2–1.2)
Total Protein: 6.6 g/dL (ref 6.0–8.3)

## 2013-07-04 LAB — TSH: TSH: 2.163 u[IU]/mL (ref 0.350–4.500)

## 2013-07-04 LAB — LIPID PANEL
Cholesterol: 180 mg/dL (ref 0–200)
HDL: 59 mg/dL (ref >39)
LDL Cholesterol: 109 mg/dL — ABNORMAL HIGH (ref 0–99)
Total CHOL/HDL Ratio: 3.1 Ratio
Triglycerides: 60 mg/dL (ref <150)
VLDL: 12 mg/dL (ref 0–40)

## 2013-07-04 NOTE — Patient Instructions (Addendum)
Document me as PCP on pts chart  Schedule CPE   To lab and xray today

## 2013-07-04 NOTE — Progress Notes (Signed)
Subjective:    Patient ID: Cheryl Castillo, female    DOB: 01-10-1956, 58 y.o.   MRN: [id number]  HPI Cheryl Castillo is here for follow up.  She wishes to change to me as PCP.  She went to a screening [place] fair and brings copy of NMR lipoprofile and thyroid screening ultrasound.  U/S indicates possible nodule in left thryoid lobe.   No mention if cystic or solid or size.    LDL 94 ,  HDL 45   TG's   140  Total  171   Particle number  [id number]   nonfasting  Allergies  Allergen Reactions  . Levaquin [Levofloxacin]     Joint Pain   Past Medical History  Diagnosis Date  . Allergy   . Cataract   . Asthma   . Depression    Past Surgical History  Procedure Laterality Date  . Cataract extraction    . Appendectomy    . Knee surgery     History   Social History  . Marital Status: Married    Spouse Name: N/A    Number of Children: N/A  . Years of Education: N/A   Occupational History  . Not on file.   Social History Main Topics  . Smoking status: Never Smoker   . Smokeless tobacco: Never Used  . Alcohol Use:   . Drug Use: No  . Sexual Activity: Yes   Other Topics Concern  . Not on file   Social History Narrative  . No narrative on file   Family History  Problem Relation Age of Onset  . Diabetes Mother   . Arthritis Mother   . Cancer Father   . Cancer Brother     skin cancer   Patient Active Problem List   Diagnosis Date Noted  . Osteopenia 05/08/2013  . Menopause 05/08/2013  . Allergic rhinitis with asthma without status asthmaticus 12/03/2011  . Hx of bacterial pneumonia 12/03/2011  . History of IBS 12/03/2011  . GERD (gastroesophageal reflux disease) 12/03/2011  . Family history of colonic polyps 12/03/2011  . Severe obstructive sleep apnea 12/03/2011  . History of mood disorder 12/03/2011   Current Outpatient Prescriptions on File Prior to Visit  Medication Sig Dispense Refill  . albuterol (PROVENTIL HFA;VENTOLIN HFA) 108 (90 BASE) MCG/ACT inhaler Inhale 2  puffs into the lungs every 6 (six) hours as needed.  1 Inhaler  2  . cycloSPORINE (RESTASIS) 0.05 % ophthalmic emulsion 1 drop 2 (two) times daily.      . mometasone (ASMANEX 60 METERED DOSES) 220 MCG/INH inhaler Inhale 2 puffs into the lungs daily.  1 Inhaler  0  . PRESCRIPTION MEDICATION 0.5 patches. Combi Patch 50/140 change patch twice weekly       No current facility-administered medications on file prior to visit.       Review of Systems See HPI    Objective:   Physical Exam Physical Exam  Nursing note and vitals reviewed.  Constitutional: She is oriented to person, place, and time. She appears well-developed and well-nourished.  HENT:  Head: Normocephalic and atraumatic.  Neck  I do not feel any discrete thyroid nodule.  No thyromegaly  No cervical adenopathy Cardiovascular: Normal rate and regular rhythm. Exam reveals no gallop and no friction rub.  No murmur heard.  Pulmonary/Chest: Breath sounds normal. She has no wheezes. She has no rales.  Neurological: She is alert and oriented to person, place, and time.  Skin: Skin is warm and dry.  Psychiatric: She has a normal mood and affect. Her behavior is normal.         Assessment & Plan:  Question of thyroid nodule  Will get ultrasound today  Screening will get fasting lipid profile.    Schedule CPE

## 2013-07-05 ENCOUNTER — Telehealth: Payer: Self-pay | Admitting: Internal Medicine

## 2013-07-05 ENCOUNTER — Encounter: Payer: Self-pay | Admitting: Internal Medicine

## 2013-07-05 DIAGNOSIS — E041 Nontoxic single thyroid nodule: Secondary | ICD-10-CM

## 2013-07-05 LAB — VITAMIN D 25 HYDROXY (VIT D DEFICIENCY, FRACTURES): Vit D, 25-Hydroxy: 70 ng/mL (ref 30–89)

## 2013-07-05 NOTE — Telephone Encounter (Signed)
Spoke with pt and given ultrasound results  Will set up thyroid biopsy

## 2013-07-05 NOTE — Telephone Encounter (Signed)
Left message for pt to call office regarding ultrasound report

## 2013-07-06 ENCOUNTER — Encounter: Payer: Self-pay | Admitting: *Deleted

## 2013-07-13 ENCOUNTER — Ambulatory Visit
Admission: RE | Admit: 2013-07-13 | Discharge: 2013-07-13 | Disposition: A | Discharge status: Home / Self Care | Payer: BC Managed Care – PPO | Source: Ambulatory Visit | Attending: Internal Medicine | Admitting: Internal Medicine

## 2013-07-13 ENCOUNTER — Other Ambulatory Visit (HOSPITAL_COMMUNITY)
Admission: RE | Admit: 2013-07-13 | Discharge: 2013-07-13 | Disposition: A | Discharge status: Home / Self Care | Payer: BC Managed Care – PPO | Source: Ambulatory Visit | Attending: Interventional Radiology | Admitting: Interventional Radiology

## 2013-07-13 DIAGNOSIS — E041 Nontoxic single thyroid nodule: Secondary | ICD-10-CM

## 2013-07-17 ENCOUNTER — Telehealth: Payer: Self-pay | Admitting: Internal Medicine

## 2013-07-17 DIAGNOSIS — E041 Nontoxic single thyroid nodule: Secondary | ICD-10-CM

## 2013-07-17 NOTE — Telephone Encounter (Signed)
Spoke with pt and informed of hyperplastic nodule on imaging   Will refer to Endocrinology for assessment

## 2013-07-27 ENCOUNTER — Encounter: Payer: Self-pay | Admitting: Internal Medicine

## 2013-07-27 ENCOUNTER — Ambulatory Visit (INDEPENDENT_AMBULATORY_CARE_PROVIDER_SITE_OTHER): Payer: BC Managed Care – PPO | Admitting: Internal Medicine

## 2013-07-27 VITALS — BP 100/59 | HR 65 | Temp 98.5°F | Resp 18 | Ht 63.5 in | Wt 158.0 lb

## 2013-07-27 DIAGNOSIS — N951 Menopausal and female climacteric states: Secondary | ICD-10-CM

## 2013-07-27 DIAGNOSIS — J45909 Unspecified asthma, uncomplicated: Secondary | ICD-10-CM

## 2013-07-27 DIAGNOSIS — M949 Disorder of cartilage, unspecified: Secondary | ICD-10-CM

## 2013-07-27 DIAGNOSIS — Z23 Encounter for immunization: Secondary | ICD-10-CM

## 2013-07-27 DIAGNOSIS — Z8659 Personal history of other mental and behavioral disorders: Secondary | ICD-10-CM

## 2013-07-27 DIAGNOSIS — K219 Gastro-esophageal reflux disease without esophagitis: Secondary | ICD-10-CM

## 2013-07-27 DIAGNOSIS — Z0000 Encounter for general adult medical examination without abnormal findings: Secondary | ICD-10-CM

## 2013-07-27 DIAGNOSIS — Z8371 Family history of colonic polyps: Secondary | ICD-10-CM

## 2013-07-27 DIAGNOSIS — Z780 Asymptomatic menopausal state: Secondary | ICD-10-CM

## 2013-07-27 DIAGNOSIS — H269 Unspecified cataract: Secondary | ICD-10-CM | POA: Insufficient documentation

## 2013-07-27 DIAGNOSIS — M899 Disorder of bone, unspecified: Secondary | ICD-10-CM

## 2013-07-27 DIAGNOSIS — E041 Nontoxic single thyroid nodule: Secondary | ICD-10-CM

## 2013-07-27 DIAGNOSIS — G4733 Obstructive sleep apnea (adult) (pediatric): Secondary | ICD-10-CM

## 2013-07-27 DIAGNOSIS — M8580 Other specified disorders of bone density and structure, unspecified site: Secondary | ICD-10-CM

## 2013-07-27 NOTE — Patient Instructions (Signed)
See me as needed 

## 2013-07-27 NOTE — Progress Notes (Signed)
Subjective:    Patient ID: Cheryl Castillo, female    DOB: 1955-12-09, 58 y.o.   MRN: [id number]  HPI Cheryl Castillo is here for CPE.  She will be travelling to [place] to scuba dive in the [place] in 2 weeks.    HM  Pap, mm, and dexa done with her GYN  Dr.  Carren Rang  Osteopenia  On ca and vitamin D  Active yoga exerciser  OSA on Cpap  Allergic rhinitis/allergic asthma  Has not needed a rescue inhaler in several weeks.  Had mold growth in home several years back and bronchspasm worsened then.  House clear now   Thyroid nodule   Path shows hyperplastic nodule with microcalcifications.  She has appt with Dr. Cruzita Lederer soon.  Sh eis asymptomatic  Mood disorder no sadness or depression     Allergies  Allergen Reactions  . Levaquin [Levofloxacin]     Joint Pain   Past Medical History  Diagnosis Date  . Allergy   . Cataract   . Asthma   . Depression    Past Surgical History  Procedure Laterality Date  . Cataract extraction    . Appendectomy    . Knee surgery     History   Social History  . Marital Status: Married    Spouse Name: N/A    Number of Children: N/A  . Years of Education: N/A   Occupational History  . Not on file.   Social History Main Topics  . Smoking status: Never Smoker   . Smokeless tobacco: Never Used  . Alcohol Use:   . Drug Use: No  . Sexual Activity: Yes   Other Topics Concern  . Not on file   Social History Narrative  . No narrative on file   Family History  Problem Relation Age of Onset  . Diabetes Mother   . Arthritis Mother   . Cancer Father   . Cancer Brother     skin cancer   Patient Active Problem List   Diagnosis Date Noted  . Thyroid nodule  bx done 06/2013 07/05/2013  . Osteopenia 05/08/2013  . Menopause 05/08/2013  . Allergic rhinitis with asthma without status asthmaticus 12/03/2011  . Hx of bacterial pneumonia 12/03/2011  . History of IBS 12/03/2011  . GERD (gastroesophageal reflux disease) 12/03/2011  . Family history of colonic  polyps 12/03/2011  . Severe obstructive sleep apnea 12/03/2011  . History of mood disorder 12/03/2011   Current Outpatient Prescriptions on File Prior to Visit  Medication Sig Dispense Refill  . albuterol (PROVENTIL HFA;VENTOLIN HFA) 108 (90 BASE) MCG/ACT inhaler Inhale 2 puffs into the lungs every 6 (six) hours as needed.  1 Inhaler  2  . cycloSPORINE (RESTASIS) 0.05 % ophthalmic emulsion 1 drop 2 (two) times daily.      . mometasone (ASMANEX 60 METERED DOSES) 220 MCG/INH inhaler Inhale 2 puffs into the lungs daily.  1 Inhaler  0  . PRESCRIPTION MEDICATION 0.5 patches. Combi Patch 50/140 change patch twice weekly       No current facility-administered medications on file prior to visit.       Review of Systems  Respiratory: Negative for choking, chest tightness, shortness of breath, wheezing and stridor.   Cardiovascular: Negative for chest pain, palpitations and leg swelling.  Gastrointestinal: Negative for abdominal pain.  All other systems reviewed and are negative.      Objective:   Physical Exam  Physical Exam  Nursing note and vitals reviewed.  Constitutional: She is  oriented to person, place, and time. She appears well-developed and well-nourished.  HENT:  Head: Normocephalic and atraumatic.  Right Ear: Tympanic membrane and ear canal normal. No drainage. Tympanic membrane is not injected and not erythematous.  Left Ear: Tympanic membrane and ear canal normal. No drainage. Tympanic membrane is not injected and not erythematous.  Nose: Nose normal. Right sinus exhibits no maxillary sinus tenderness and no frontal sinus tenderness. Left sinus exhibits no maxillary sinus tenderness and no frontal sinus tenderness.  Mouth/Throat: Oropharynx is clear and moist. No oral lesions. No oropharyngeal exudate.  Eyes: Conjunctivae and EOM are normal. Pupils are equal, round, and reactive to light.  Neck: Normal range of motion. Neck supple. No JVD present. Carotid bruit is not  present. No mass and no thyromegaly present.  Cardiovascular: Normal rate, regular rhythm, S1 normal, S2 normal and intact distal pulses. Exam reveals no gallop and no friction rub.  No murmur heard.  Pulses:  Carotid pulses are 2+ on the right side, and 2+ on the left side.  Dorsalis pedis pulses are 2+ on the right side, and 2+ on the left side.  No carotid bruit. No LE edema  Pulmonary/Chest: Breath sounds normal. She has no wheezes. She has no rales. She exhibits no tenderness.  Breast no discrete mass no nipple discharge no axillary adenopathy bilaterally Abdominal: Soft. Bowel sounds are normal. She exhibits no distension and no mass. There is no hepatosplenomegaly. There is no tenderness. There is no CVA tenderness.  Musculoskeletal: Normal range of motion.  No active synovitis to joints.  Lymphadenopathy:  She has no cervical adenopathy.  She has no axillary adenopathy.  Right: No inguinal and no supraclavicular adenopathy present.  Left: No inguinal and no supraclavicular adenopathy present.  Neurological: She is alert and oriented to person, place, and time. She has normal strength and normal reflexes. She displays no tremor. No cranial nerve deficit or sensory deficit. Coordination and gait normal.  Skin: Skin is warm and dry. No rash noted. No cyanosis. Nails show no clubbing.  Psychiatric: She has a normal mood and affect. Her speech is normal and behavior is normal. Cognition and memory are normal.           Assessment & Plan:  HM  See above  Tdap given today  UTD with colonoscopy  Counseled lung ca screening guidelines  Pt is a nonsmoker  All rhintis /  Asthma  Inactive now  Rescue inhaler prn  Thyroid hyperplastic nodule with calcification  Upcoming appt with endocrine  Osteopenia on ca/vit D and exercise  OSA  On CPAP  Mood disorder  Off any meds now

## 2013-07-28 ENCOUNTER — Ambulatory Visit (INDEPENDENT_AMBULATORY_CARE_PROVIDER_SITE_OTHER): Payer: BC Managed Care – PPO | Admitting: Internal Medicine

## 2013-07-28 ENCOUNTER — Encounter: Payer: Self-pay | Admitting: Internal Medicine

## 2013-07-28 VITALS — BP 108/60 | HR 78 | Temp 98.8°F | Resp 12 | Ht 63.5 in | Wt 161.0 lb

## 2013-07-28 DIAGNOSIS — E041 Nontoxic single thyroid nodule: Secondary | ICD-10-CM

## 2013-07-28 NOTE — Patient Instructions (Signed)
Please return in 1 year.  We will need a thyroid ultrasound again at that time.

## 2013-07-28 NOTE — Progress Notes (Signed)
Patient ID: Cheryl Castillo, female   DOB: 1955-07-28, 58 y.o.   MRN: [id number]   HPI  Cheryl Castillo is a 58 y.o.-year-old female, referred by her PCP, Dr.Schoenhoff, for evaluation for thyroid nodules.  Patient was found to have thyroid nodules at the health fair screen. She subsequently saw her PCP order a thyroid ultrasound that showed 3 nodules.  Thyroid U/S (07/04/2013): 3 thyroid nodules:  Right thyroid lobe: 1.) Small 0.5 x 0.4 x 0.3 cm solid hypoechoic nodule in the mid aspect of the gland.  2.) Ovoid 1.1 x 1.0 x 0.6 cm solid isoechoic nodule in the inferior aspect of the gland.   Left thyroid lobe  1.) 1.2 x 0.9 x 0.7 cm circumscribed hypoechoic nodule with internal microcalcifications in the mid aspect of the gland.  Pt had a Bx of the L thyroid nodule (07/13/2013): benign: hyperplastic nodule  I reviewed pt's thyroid tests - TSH normal last month: Lab Results  Component Value Date   TSH 2.163 07/04/2013    Pt denies feeling nodules in neck, hoarseness, dysphagia/odynophagia, SOB with lying down.  Pt c/o: - + heat intolerance (hot flushes) - no tremors - no palpitations - no anxiety/depression - no hyperdefecation/+ constipation - no weight loss - + weight gain - no dry skin - no hair falling - no fatigue  Pt does not have a FH of thyroid ds. No FH of thyroid cancer. No h/o radiation tx to head or neck.  ROS: Constitutional: see history of present illness Eyes: no blurry vision, + xerophthalmia ENT: no sore throat, no nodules palpated in throat, no dysphagia/odynophagia, no hoarseness Cardiovascular: no CP/SOB/palpitations/leg swelling Respiratory: no cough/SOB Gastrointestinal: no N/V/D/+ C Musculoskeletal: no muscle/joint aches Skin: no rashes Neurological: no tremors/numbness/tingling/dizziness, + HAs Psychiatric: no depression/anxiety + Low libido  Past Medical History  Diagnosis Date  . Allergy   . Cataract   . Asthma   . Depression    Past  Surgical History  Procedure Laterality Date  . Cataract extraction    . Appendectomy    . Knee surgery     History   Social History  . Marital Status: Married    Spouse Name: N/A    Number of Children: 2   Occupational History  . artist   Social History Main Topics  . Smoking status: Never Smoker   . Smokeless tobacco: Never Used  . Alcohol Use:   . Drug Use: No   Current Outpatient Prescriptions on File Prior to Visit  Medication Sig Dispense Refill  . albuterol (PROVENTIL HFA;VENTOLIN HFA) 108 (90 BASE) MCG/ACT inhaler Inhale 2 puffs into the lungs every 6 (six) hours as needed.  1 Inhaler  2  . cycloSPORINE (RESTASIS) 0.05 % ophthalmic emulsion 1 drop 2 (two) times daily.      . mometasone (ASMANEX 60 METERED DOSES) 220 MCG/INH inhaler Inhale 2 puffs into the lungs daily.  1 Inhaler  0  . PRESCRIPTION MEDICATION 0.5 patches. Combi Patch 50/140 change patch twice weekly       No current facility-administered medications on file prior to visit.   Allergies  Allergen Reactions  . Levaquin [Levofloxacin]     Joint Pain   Family History  Problem Relation Age of Onset  . Diabetes Mother   . Arthritis Mother   . Cancer Father   . Cancer Brother     skin cancer   PE: BP 108/60  Pulse 78  Temp(Src) 98.8 F (37.1 C) (Oral)  Resp 12  Ht 5' 3.5" (1.613 m)  Wt 161 lb (73.029 kg)  BMI 28.07 kg/m2  SpO2 96% Wt Readings from Last 3 Encounters:  07/28/13 161 lb (73.029 kg)  07/27/13 158 lb (71.668 kg)  07/04/13 158 lb (71.668 kg)   Constitutional: overweight, in NAD Eyes: PERRLA, EOMI, no exophthalmos ENT: moist mucous membranes, no thyromegaly, no individual thyroid nodules palpated, no cervical lymphadenopathy Cardiovascular: RRR, No MRG Respiratory: CTA B Gastrointestinal: abdomen soft, NT, ND, BS+ Musculoskeletal: no deformities, strength intact in all 4;  Skin: moist, warm, no rashes Neurological: no tremor with outstretched hands, DTR 2/5 in all  4   ASSESSMENT: 1. Thyroid nodules - 3  Right thyroid lobe: 1.) Small 0.5 x 0.4 x 0.3 cm solid hypoechoic nodule in the mid aspect of the gland.  2.) Ovoid 1.1 x 1.0 x 0.6 cm solid isoechoic nodule in the inferior aspect of the gland.   Left thyroid lobe  1.) 1.2 x 0.9 x 0.7 cm circumscribed hypoechoic nodule with internal microcalcifications in the mid aspect of the gland.  PLAN: 1. Thyroid nodules  - I reviewed the images for the thyroid U/S and also the movie of her nodule FNA. I also reviewed the report of her thyroid ultrasound along with the patient. I pointed out that the dominant nodules are small and the one that was Bx'ed contained calcifications, which is a risk factor for cancer.  Pt does not have a thyroid cancer family history or a personal history of RxTx to head/neck. All these, along with a benign Bx would favor benignity.  - the fact that one of the nodules contains calcifications can be worrisome, however the fact that the biopsy was benign is reassuring. However, I discussed with the patient to obtain another ultrasound at a year from now and then possibly repeat the biopsy at that time. If the second biopsy of the left thyroid nodule is still benign, the chances of cancer I probably 0. - I also advised her to let me know if she develops neck compression symptoms before next visit. She denies any neck compression symptoms now. - I'll see her back in a year

## 2013-11-13 ENCOUNTER — Ambulatory Visit (INDEPENDENT_AMBULATORY_CARE_PROVIDER_SITE_OTHER): Payer: BC Managed Care – PPO | Admitting: *Deleted

## 2013-11-13 DIAGNOSIS — Z23 Encounter for immunization: Secondary | ICD-10-CM

## 2013-11-17 ENCOUNTER — Ambulatory Visit (INDEPENDENT_AMBULATORY_CARE_PROVIDER_SITE_OTHER): Payer: BC Managed Care – PPO | Admitting: Family Medicine

## 2013-11-17 ENCOUNTER — Encounter: Payer: Self-pay | Admitting: Family Medicine

## 2013-11-17 VITALS — BP 120/50 | HR 65 | Temp 98.6°F | Resp 16 | Ht 63.0 in | Wt 158.4 lb

## 2013-11-17 DIAGNOSIS — Z119 Encounter for screening for infectious and parasitic diseases, unspecified: Secondary | ICD-10-CM

## 2013-11-17 DIAGNOSIS — Z23 Encounter for immunization: Secondary | ICD-10-CM

## 2013-11-17 DIAGNOSIS — J4521 Mild intermittent asthma with (acute) exacerbation: Secondary | ICD-10-CM

## 2013-11-17 LAB — HEPATITIS B SURFACE ANTIBODY, QUANTITATIVE: Hepatitis B-Post: 0 m[IU]/mL

## 2013-11-17 LAB — HEPATITIS A ANTIBODY, IGM: Hep A IgM: NONREACTIVE

## 2013-11-17 MED ORDER — CIPROFLOXACIN HCL 500 MG PO TABS: 500.0000 mg | ORAL_TABLET | Freq: Two times a day (BID) | ORAL | Status: DC

## 2013-11-17 MED ORDER — BUDESONIDE-FORMOTEROL FUMARATE 160-4.5 MCG/ACT IN AERO: 2.0000 | INHALATION_SPRAY | Freq: Two times a day (BID) | RESPIRATORY_TRACT | Status: DC

## 2013-11-17 MED ORDER — ALBUTEROL SULFATE HFA 108 (90 BASE) MCG/ACT IN AERS: 2.0000 | INHALATION_SPRAY | Freq: Four times a day (QID) | RESPIRATORY_TRACT | Status: DC | PRN

## 2013-11-17 NOTE — Patient Instructions (Signed)
You are being vaccinated against Hep A and Hep B. We are checking you antibody status but results will not be available until next week.  You will need to return after your trip to [place] to finish the vaccination series (2 for Hep A and 3 for Hep B).  You also can get your prescription for Shingles and get that vaccine after you complete the hepatitis vaccine series.  We can discuss Prevnar later; the recommendation is to wait 1 year after Pneumovax to receive Prevnar13.  You can get this vaccine next year.

## 2013-11-21 NOTE — Progress Notes (Signed)
S:  This 58 y.o. Cauc female is traveling to [place] w/ her husband. She needs immunization review and requests RX: Cipro for treatment of "Traveler's diarrhea". They will be staying in [place] for 2 days in a hotel but [place]. Pt has no record of immunization for Hepatitis A or B.  She agrees to testing for immunity and beginning vaccination; she will not be in [place] next week.and flight to [place] is the following week.  Pt has Asthma; she has been using MDI samples and has a Asmanex sample as well as Symbicort. Most samples are out-of-date. Pt reports minimal symptoms, w/ exacerbation after strenuous exercise and seasonal weather changes. She uses ICS- LABA inhaler twice a week "if needed". Pt denies recent respiratory infection, fever/chills, sore throat, rhinorrhea, PND, cough or SOB.  Patient Active Problem List   Diagnosis Date Noted  . Cataract 07/27/2013  . Thyroid nodule  bx done 06/2013 07/05/2013  . Osteopenia 05/08/2013  . Menopause 05/08/2013  . Allergic rhinitis with asthma without status asthmaticus 12/03/2011  . Hx of bacterial pneumonia 12/03/2011  . History of IBS 12/03/2011  . GERD (gastroesophageal reflux disease) 12/03/2011  . Family history of colonic polyps 12/03/2011  . Severe obstructive sleep apnea 12/03/2011  . History of mood disorder 12/03/2011    Prior to Admission medications   Medication Sig Start Date End Date Taking? Authorizing Provider  albuterol (PROVENTIL HFA;VENTOLIN HFA) 108 (90 BASE) MCG/ACT inhaler Inhale 2 puffs into the lungs every 6 (six) hours as needed.   Yes Historical Provider, MD  cycloSPORINE (RESTASIS) 0.05 % ophthalmic emulsion 1 drop 2 (two) times daily.   Yes Historical Provider, MD  PRESCRIPTION MEDICATION 0.5 patches. Combi Patch 50/140 change patch twice weekly   Yes Historical Provider, MD  budesonide-formoterol (SYMBICORT) 160-4.5 MCG/ACT inhaler Inhale 2 puffs into the lungs 2 (two) times  daily.    Historical Provider, MD  Mometasone (ASMANEX 60 METERED DOSES) 220 MCG/INH inhaler Inhale 2 puffs into the lungs daily.    Historical Provider, MD    History   Social History  . Marital Status: Married    Spouse Name: N/A    Number of Children: N/A  . Years of Education: N/A   Occupational History  . Retired.   Social History Main Topics  . Smoking status: Never Smoker   . Smokeless tobacco: Never Used  . Alcohol Use:   . Drug Use: No  . Sexual Activity: Yes   Other Topics Concern  . Not on file   Social History Narrative  . No narrative on file    Family History  Problem Relation Age of Onset  . Diabetes Mother   . Arthritis Mother   . Cancer Father   . Cancer Brother     skin cancer    ROS: As per HPI.  O: Filed Vitals:   11/17/13 0937  BP: 120/50  Pulse: 65  Temp: 98.6 F (37 C)  Resp: 16    GEN: In NAD; WN,WD. HEENT: [place]/AT; EOMI w/ clear conj/sclerae. Ext ears/canals/TMs normal. Nose w/ normal mucosa and straight septum. Post ph w/ normal uvula; tongue and dentition normal. NECK: Supple w/o LAN. COR: RRR. Normal S1 and S2. LUNGS: CTA; normal resp rate and effort. No wheezes. SKIN: W&D; intact w/o erythema, rashes or pallor. NEURO: A&O x 3; CNs intact. Nonfocal.  A/P: Asthma with acute exacerbation, mild intermittent- Advised to wear mask while in [place] (pollution is severe). Use ICS-LABA  MDI daily.  Special screening examination for infectious diseases - Will test for immunity against Hep A and B. Plan: Hepatitis B surface antibody, Hepatitis A Antibody, IGM, Hepatitis A vaccine adult IM, Hepatitis B vaccine adult IM Pt will return for remainder of vaccination series after return from [place].   Meds ordered this encounter  Medications  . ciprofloxacin (CIPRO) 500 MG tablet    Sig: Take 1 tablet (500 mg total) by mouth 2 (two) times daily.    Dispense:  20 tablet    Refill:  0  . albuterol (PROVENTIL HFA;VENTOLIN HFA) 108 (90 BASE)  MCG/ACT inhaler    Sig: Inhale 2 puffs into the lungs every 6 (six) hours as needed.    Dispense:  1 Inhaler    Refill:  2  . budesonide-formoterol (SYMBICORT) 160-4.5 MCG/ACT inhaler    Sig: Inhale 2 puffs into the lungs 2 (two) times daily.    Dispense:  1 Inhaler    Refill:  3

## 2013-12-20 ENCOUNTER — Encounter: Payer: BC Managed Care – PPO | Admitting: Internal Medicine

## 2014-01-16 ENCOUNTER — Other Ambulatory Visit: Payer: Self-pay | Admitting: Dermatology

## 2014-01-31 ENCOUNTER — Telehealth: Payer: Self-pay | Admitting: *Deleted

## 2014-01-31 ENCOUNTER — Telehealth: Payer: Self-pay

## 2014-01-31 DIAGNOSIS — M25579 Pain in unspecified ankle and joints of unspecified foot: Secondary | ICD-10-CM

## 2014-01-31 MED ORDER — CELECOXIB 100 MG PO CAPS: 100.0000 mg | ORAL_CAPSULE | Freq: Two times a day (BID) | ORAL | Status: AC

## 2014-01-31 NOTE — Telephone Encounter (Signed)
Sent Celebrex in as requested. Called pharmacy to confirm generic is available and gave verbal directions "one daily prn". Called pt she was not happy that we would not call in 2 times daily and a 3 month supply. I stated to pt that she did not specify this in the phone calls before. I verified that she only takes Celebrex prn and she confirmed but still wanted 2 times daily for 3 month supply. [facility] they are the [facility] ones that will do 3 month supply. I explained to pt that I would let Dr. Coralyn Mark know her concerns. Pt expressed understanding.

## 2014-01-31 NOTE — Telephone Encounter (Signed)
Cheryl Castillo   Call pt and let her know that she never mentioned Celebrex to me at her visit with me it is not on her new pt form  Who prescribed this for her in the past and what is she taking it for ?  I Tell  Pt I am not sure there is a generic version of Celebrex

## 2014-01-31 NOTE — Telephone Encounter (Signed)
Cheryl Castillo [phone] [facility]  [facility] called to see if we could call in Generic Celebrex 200 mg BID, 90 day supply,

## 2014-01-31 NOTE — Telephone Encounter (Signed)
OK to call in Celebrex  100 mg #30 one daily prn  .  Let her know that I do not think they make a generic of this and insurance usually does not want to pay for this one  But ok to try to call in for her

## 2014-01-31 NOTE — Telephone Encounter (Signed)
Dr. Coralyn Mark,  I don't see where this pt has ever had Celebrex ordered. Can you check on this and let me know what you would like to do?  Thanks MH

## 2014-01-31 NOTE — Telephone Encounter (Signed)
Pt called in stating her husband is a podiatrist and she used to be able to get Celebrex samples from his office but no longer is able to do this. She takes Celebrex prn after strenuous exercise and would like to have some on hand as needed. Please advise if this can be sent in to pharm.  Thanks MH

## 2014-01-31 NOTE — Telephone Encounter (Signed)
LM for pt to rtn call need info on Celebrex.Marland KitchenMarland KitchenMarland Kitchen

## 2014-03-21 ENCOUNTER — Telehealth: Payer: Self-pay

## 2014-03-21 NOTE — Telephone Encounter (Signed)
Patient would like to know if she has been tested for Hep C   [phone]

## 2014-04-11 NOTE — Telephone Encounter (Signed)
Spoke with patient. She has only been tested for Hep A and Hep B. She is currently out of town but she will call back tomorrow and set up an appt for Hep C testing.

## 2014-05-08 ENCOUNTER — Other Ambulatory Visit: Payer: Self-pay | Admitting: Gynecology

## 2014-05-09 LAB — CYTOLOGY - PAP

## 2014-08-29 ENCOUNTER — Telehealth: Payer: Self-pay

## 2014-09-26 ENCOUNTER — Ambulatory Visit (INDEPENDENT_AMBULATORY_CARE_PROVIDER_SITE_OTHER): Payer: BLUE CROSS/BLUE SHIELD | Admitting: Emergency Medicine

## 2014-09-26 VITALS — BP 102/70 | HR 65 | Temp 98.7°F | Resp 16 | Ht 63.0 in | Wt 161.0 lb

## 2014-09-26 DIAGNOSIS — G4733 Obstructive sleep apnea (adult) (pediatric): Secondary | ICD-10-CM

## 2014-09-26 DIAGNOSIS — R5383 Other fatigue: Secondary | ICD-10-CM

## 2014-09-26 LAB — TSH: TSH: 1.646 u[IU]/mL (ref 0.350–4.500)

## 2014-09-26 LAB — CBC WITH DIFFERENTIAL/PLATELET
BASOS PCT: 1 % (ref 0–1)
Basophils Absolute: 0.1 10*3/uL (ref 0.0–0.1)
Eosinophils Absolute: 0.1 10*3/uL (ref 0.0–0.7)
Eosinophils Relative: 2 % (ref 0–5)
HCT: 35 % — ABNORMAL LOW (ref 36.0–46.0)
Hemoglobin: 12.5 g/dL (ref 12.0–15.0)
LYMPHS PCT: 36 % (ref 12–46)
Lymphs Abs: 2.4 10*3/uL (ref 0.7–4.0)
MCH: 30.3 pg (ref 26.0–34.0)
MCHC: 35.7 g/dL (ref 30.0–36.0)
MCV: 85 fL (ref 78.0–100.0)
MPV: 9.4 fL (ref 8.6–12.4)
Monocytes Absolute: 0.4 10*3/uL (ref 0.1–1.0)
Monocytes Relative: 6 % (ref 3–12)
NEUTROS ABS: 3.7 10*3/uL (ref 1.7–7.7)
NEUTROS PCT: 55 % (ref 43–77)
Platelets: 292 10*3/uL (ref 150–400)
RBC: 4.12 MIL/uL (ref 3.87–5.11)
RDW: 13.3 % (ref 11.5–15.5)
WBC: 6.7 10*3/uL (ref 4.0–10.5)

## 2014-09-26 LAB — COMPREHENSIVE METABOLIC PANEL
ALT: 18 U/L (ref 6–29)
AST: 19 U/L (ref 10–35)
Albumin: 4.4 g/dL (ref 3.6–5.1)
Alkaline Phosphatase: 46 U/L (ref 33–130)
BUN: 19 mg/dL (ref 7–25)
CALCIUM: 10 mg/dL (ref 8.6–10.4)
CHLORIDE: 104 mmol/L (ref 98–110)
CO2: 28 mmol/L (ref 20–31)
Creat: 0.95 mg/dL (ref 0.50–0.99)
Glucose, Bld: 96 mg/dL (ref 65–99)
POTASSIUM: 4.6 mmol/L (ref 3.5–5.3)
Sodium: 140 mmol/L (ref 135–146)
TOTAL PROTEIN: 6.6 g/dL (ref 6.1–8.1)
Total Bilirubin: 0.3 mg/dL (ref 0.2–1.2)

## 2014-09-26 MED ORDER — ALBUTEROL SULFATE HFA 108 (90 BASE) MCG/ACT IN AERS: 2.0000 | INHALATION_SPRAY | Freq: Four times a day (QID) | RESPIRATORY_TRACT | Status: DC | PRN

## 2014-09-26 MED ORDER — BUDESONIDE-FORMOTEROL FUMARATE 160-4.5 MCG/ACT IN AERO: 2.0000 | INHALATION_SPRAY | Freq: Two times a day (BID) | RESPIRATORY_TRACT | Status: AC

## 2014-09-26 NOTE — Progress Notes (Signed)
Subjective:  Patient ID: Cheryl Castillo, female    DOB: February 11, 1956  Age: 59 y.o. MRN: [id number]  CC: Fatigue; Generalized Body Aches; Otalgia; sinus pressure; immunizations; and Medication Refill   HPI Cheryl Castillo presents  with vague complaints of fatigue. She has some muscle aches and pressure in her ears and pressure sinus she denies any fever chills nausea vomiting no stool change. She has no cough or wheezing or shortness breath. She does have obstructive sleep apnea and has a six-year-old CPAP machine that she hasn't been performing maintenance on. She said she feels as she did before she started CPAP. He is getting ready to leave on a several week trip to [place] on Sunday and is concerned that maybe she needs a "Z-Pak" to take with her just in case she gets sick  History Cheryl Castillo has a past medical history of Allergy; Cataract; Asthma; and Depression.   She has past surgical history that includes Cataract extraction; Appendectomy; and Knee surgery.   Her  family history includes Arthritis in her mother; Cancer in her brother and father; Diabetes in her mother.  She   reports that she has never smoked. She has never used smokeless tobacco. She reports that she does not use illicit drugs. Her alcohol history is not on file.  Outpatient Prescriptions Prior to Visit  Medication Sig Dispense Refill  . celecoxib (CELEBREX) 100 MG capsule Take 1 capsule (100 mg total) by mouth 2 (two) times daily. 30 capsule 0  . cycloSPORINE (RESTASIS) 0.05 % ophthalmic emulsion 1 drop 2 (two) times daily.    Marland Kitchen PRESCRIPTION MEDICATION 0.5 patches. Combi Patch 50/140 change patch twice weekly    . albuterol (PROVENTIL HFA;VENTOLIN HFA) 108 (90 BASE) MCG/ACT inhaler Inhale 2 puffs into the lungs every 6 (six) hours as needed. 1 Inhaler 2  . budesonide-formoterol (SYMBICORT) 160-4.5 MCG/ACT inhaler Inhale 2 puffs into the lungs 2 (two) times daily. 1 Inhaler 3  . ciprofloxacin (CIPRO) 500 MG tablet Take  1 tablet (500 mg total) by mouth 2 (two) times daily. (Patient not taking: Reported on 09/26/2014) 20 tablet 0   No facility-administered medications prior to visit.    Social History   Social History  . Marital Status: Married    Spouse Name: N/A  . Number of Children: N/A  . Years of Education: N/A   Social History Main Topics  . Smoking status: Never Smoker   . Smokeless tobacco: Never Used  . Alcohol Use: None  . Drug Use: No  . Sexual Activity: Yes   Other Topics Concern  . None   Social History Narrative     Review of Systems  Constitutional: Negative for fever, chills and appetite change.  HENT: Negative for congestion, ear pain, postnasal drip, sinus pressure and sore throat.   Eyes: Negative for pain and redness.  Respiratory: Negative for cough, shortness of breath and wheezing.   Cardiovascular: Negative for leg swelling.  Gastrointestinal: Negative for nausea, vomiting, abdominal pain, diarrhea, constipation and blood in stool.  Endocrine: Negative for polyuria.  Genitourinary: Negative for dysuria, urgency, frequency and flank pain.  Musculoskeletal: Negative for gait problem.  Skin: Negative for rash.  Neurological: Negative for weakness and headaches.  Psychiatric/Behavioral: Negative for confusion and decreased concentration. The patient is not nervous/anxious.     Objective:  BP 102/70 mmHg  Pulse 65  Temp(Src) 98.7 F (37.1 C) (Oral)  Resp 16  Ht 5\' 3"  (1.6 m)  Wt 161 lb (73.029 kg)  BMI 28.53 kg/m2  SpO2 98%  Physical Exam  Constitutional: She is oriented to person, place, and time. She appears well-developed and well-nourished. No distress.  HENT:  Head: Normocephalic and atraumatic.  Right Ear: External ear normal.  Left Ear: External ear normal.  Nose: Nose normal.  Eyes: Conjunctivae and EOM are normal. Pupils are equal, round, and reactive to light. No scleral icterus.  Neck: Normal range of motion. Neck supple. No tracheal deviation  present.  Cardiovascular: Normal rate, regular rhythm and normal heart sounds.   Pulmonary/Chest: Effort normal. No respiratory distress. She has no wheezes. She has no rales.  Abdominal: She exhibits no mass. There is no tenderness. There is no rebound and no guarding.  Musculoskeletal: She exhibits no edema.  Lymphadenopathy:    She has no cervical adenopathy.  Neurological: She is alert and oriented to person, place, and time. Coordination normal.  Skin: Skin is warm and dry. No rash noted.  Psychiatric: She has a normal mood and affect. Her behavior is normal.      Assessment & Plan:   Cheryl Castillo was seen today for fatigue, generalized body aches, otalgia, sinus pressure, immunizations and medication refill.  Diagnoses and all orders for this visit:  Other fatigue -     Cancel: Hepatitis C Ab Reflex HCV RNA, QUANT -     Cancel: POCT CBC -     Comprehensive metabolic panel -     TSH -     Hepatitis C antibody -     CBC with Differential/Platelet  Obstructive sleep apnea -     Hepatitis C antibody -     CBC with Differential/Platelet  Other orders -     albuterol (PROVENTIL HFA;VENTOLIN HFA) 108 (90 BASE) MCG/ACT inhaler; Inhale 2 puffs into the lungs every 6 (six) hours as needed. -     budesonide-formoterol (SYMBICORT) 160-4.5 MCG/ACT inhaler; Inhale 2 puffs into the lungs 2 (two) times daily.   I am having Cheryl Castillo maintain her PRESCRIPTION MEDICATION, cycloSPORINE, ciprofloxacin, celecoxib, albuterol, and budesonide-formoterol.  Meds ordered this encounter  Medications  . albuterol (PROVENTIL HFA;VENTOLIN HFA) 108 (90 BASE) MCG/ACT inhaler    Sig: Inhale 2 puffs into the lungs every 6 (six) hours as needed.    Dispense:  1 Inhaler    Refill:  2  . budesonide-formoterol (SYMBICORT) 160-4.5 MCG/ACT inhaler    Sig: Inhale 2 puffs into the lungs 2 (two) times daily.    Dispense:  1 Inhaler    Refill:  3    Appropriate red flag conditions were discussed with the  patient as well as actions that should be taken.  Patient expressed his understanding.  Follow-up: Return if symptoms worsen or fail to improve.  Roselee Culver, MD

## 2014-09-26 NOTE — Patient Instructions (Signed)
Sleep Apnea  Sleep apnea is a sleep disorder characterized by abnormal pauses in breathing while you sleep. When your breathing pauses, the level of oxygen in your blood decreases. This causes you to move out of deep sleep and into light sleep. As a result, your quality of sleep is poor, and the system that carries your blood throughout your body (cardiovascular system) experiences stress. If sleep apnea remains untreated, the following conditions can develop:  High blood pressure (hypertension).  Coronary artery disease.  Inability to achieve or maintain an erection (impotence).  Impairment of your thought process (cognitive dysfunction). There are three types of sleep apnea: 1. Obstructive sleep apnea--Pauses in breathing during sleep because of a blocked airway. 2. Central sleep apnea--Pauses in breathing during sleep because the area of the brain that controls your breathing does not send the correct signals to the muscles that control breathing. 3. Mixed sleep apnea--A combination of both obstructive and central sleep apnea. RISK FACTORS The following risk factors can increase your risk of developing sleep apnea:  Being overweight.  Smoking.  Having narrow passages in your nose and throat.  Being of older age.  Being female.  Alcohol use.  Sedative and tranquilizer use.  Ethnicity. Among individuals younger than 35 years, African Americans are at increased risk of sleep apnea. SYMPTOMS   Difficulty staying asleep.  Daytime sleepiness and fatigue.  Loss of energy.  Irritability.  Loud, heavy snoring.  Morning headaches.  Trouble concentrating.  Forgetfulness.  Decreased interest in sex. DIAGNOSIS  In order to diagnose sleep apnea, your caregiver will perform a physical examination. Your caregiver may suggest that you take a home sleep test. Your caregiver may also recommend that you spend the night in a sleep lab. In the sleep lab, several monitors record  information about your heart, lungs, and brain while you sleep. Your leg and arm movements and blood oxygen level are also recorded. TREATMENT The following actions may help to resolve mild sleep apnea:  Sleeping on your side.   Using a decongestant if you have nasal congestion.   Avoiding the use of depressants, including alcohol, sedatives, and narcotics.   Losing weight and modifying your diet if you are overweight. There also are devices and treatments to help open your airway:  Oral appliances. These are custom-made mouthpieces that shift your lower jaw forward and slightly open your bite. This opens your airway.  Devices that create positive airway pressure. This positive pressure "splints" your airway open to help you breathe better during sleep. The following devices create positive airway pressure:  Continuous positive airway pressure (CPAP) device. The CPAP device creates a continuous level of air pressure with an air pump. The air is delivered to your airway through a mask while you sleep. This continuous pressure keeps your airway open.  Nasal expiratory positive airway pressure (EPAP) device. The EPAP device creates positive air pressure as you exhale. The device consists of single-use valves, which are inserted into each nostril and held in place by adhesive. The valves create very little resistance when you inhale but create much more resistance when you exhale. That increased resistance creates the positive airway pressure. This positive pressure while you exhale keeps your airway open, making it easier to breath when you inhale again.  Bilevel positive airway pressure (BPAP) device. The BPAP device is used mainly in patients with central sleep apnea. This device is similar to the CPAP device because it also uses an air pump to deliver continuous air pressure   through a mask. However, with the BPAP machine, the pressure is set at two different levels. The pressure when you  exhale is lower than the pressure when you inhale.  Surgery. Typically, surgery is only done if you cannot comply with less invasive treatments or if the less invasive treatments do not improve your condition. Surgery involves removing excess tissue in your airway to create a wider passage way. Document Released: 01/23/2002 Document Revised: 05/30/2012 Document Reviewed: 06/11/2011 [employer] Patient Information 2015 [employer]. This information is not intended to replace advice given to you by your health care provider. Make sure you discuss any questions you have with your health care provider.  

## 2014-09-27 LAB — HEPATITIS C ANTIBODY: HCV Ab: NEGATIVE

## 2014-11-06 NOTE — Telephone Encounter (Signed)
no

## 2015-06-10 ENCOUNTER — Telehealth: Payer: Self-pay | Admitting: Neurology

## 2015-06-10 NOTE — Telephone Encounter (Signed)
I had an extensive conversation with the pt. Pt states "[facility] screwed up my bill, so I want to switch to [facility] and [facility] needs an order from you guys." I advised her that since pt has not been seen here in over a year, Dr. Brett Fairy cannot provide a new order. Pt must be seen and evaluated in the clinic before she can generate a new order. Pt states, "My cpap is going fine,  I don't need to spend $50 for an appointment." I again reiterated that the order cannot be generated without an office visit. Pt asked how to make an appt. I advised her that since she hasn't been seen in our office for over 3 years, she would need a new referral in by another doctor, per [facility]. I advised her that usually a PCP can make this referral. Pt states, "I don't have a PCP. Mine retired. I can't believe I am going to have to spend about $100 when my cpap is working fine. I can't believe I'm going to have to see a brand new doctor that is going to have to send me to you guys when I saw you guys 6.5 years ago already." I apologized for the inconvenience, but this is office policy. Pt states, " I will just figure this out." I advised her that [facility] might be the best option if she can't come in for another office visit, if [facility] has not advised her that they will stop sending her supplies since she hasn't been seen in so long. Pt again states, " I will figure it out." I encouraged pt to call back with further questions or concerns.

## 2015-06-10 NOTE — Telephone Encounter (Signed)
Patient called to advise, she is changing DME Provider from [facility] to [facility] for CPAP supplies, was advised by [facility] that she will need a new Rx. Patient advised she will need appointment for this since it's been over a year. Patient does not want to schedule appointment, states her equipment is working just fine. Please call patient to advise.

## 2015-07-04 DIAGNOSIS — H31002 Unspecified chorioretinal scars, left eye: Secondary | ICD-10-CM | POA: Diagnosis not present

## 2015-07-04 DIAGNOSIS — H31013 Macula scars of posterior pole (postinflammatory) (post-traumatic), bilateral: Secondary | ICD-10-CM | POA: Diagnosis not present

## 2015-07-04 DIAGNOSIS — H35031 Hypertensive retinopathy, right eye: Secondary | ICD-10-CM | POA: Diagnosis not present

## 2015-07-04 DIAGNOSIS — H35032 Hypertensive retinopathy, left eye: Secondary | ICD-10-CM | POA: Diagnosis not present

## 2015-07-04 DIAGNOSIS — H35362 Drusen (degenerative) of macula, left eye: Secondary | ICD-10-CM | POA: Diagnosis not present

## 2015-07-04 DIAGNOSIS — H04123 Dry eye syndrome of bilateral lacrimal glands: Secondary | ICD-10-CM | POA: Diagnosis not present

## 2015-07-04 DIAGNOSIS — H43813 Vitreous degeneration, bilateral: Secondary | ICD-10-CM | POA: Diagnosis not present

## 2015-07-11 DIAGNOSIS — Z0000 Encounter for general adult medical examination without abnormal findings: Secondary | ICD-10-CM | POA: Diagnosis not present

## 2015-07-17 DIAGNOSIS — Z0000 Encounter for general adult medical examination without abnormal findings: Secondary | ICD-10-CM | POA: Diagnosis not present

## 2015-07-17 DIAGNOSIS — M8580 Other specified disorders of bone density and structure, unspecified site: Secondary | ICD-10-CM | POA: Diagnosis not present

## 2015-07-18 ENCOUNTER — Ambulatory Visit (INDEPENDENT_AMBULATORY_CARE_PROVIDER_SITE_OTHER): Payer: BLUE CROSS/BLUE SHIELD | Admitting: Neurology

## 2015-07-18 ENCOUNTER — Encounter: Payer: Self-pay | Admitting: Neurology

## 2015-07-18 VITALS — BP 108/72 | HR 76 | Resp 20 | Ht 63.0 in | Wt 161.0 lb

## 2015-07-18 DIAGNOSIS — Z9989 Dependence on other enabling machines and devices: Secondary | ICD-10-CM | POA: Insufficient documentation

## 2015-07-18 DIAGNOSIS — G4733 Obstructive sleep apnea (adult) (pediatric): Secondary | ICD-10-CM | POA: Diagnosis not present

## 2015-07-18 NOTE — Patient Instructions (Signed)

## 2015-07-18 NOTE — Progress Notes (Signed)
[facility]   Provider:  Larey Seat, M D  Referring Provider: Lanice Shirts, * Primary Care Physician:  Tawanna Solo, MD  Chief Complaint  Patient presents with  . New Patient (Initial Visit)    needs supplies for cpap, wants to use [facility], cpap chip does not work, sometimes her cpap will not turn on, rm 10, alone    HPI:  Cheryl Castillo is a 61 y.o. female , seen here as a referral  from Dr. Coralyn Mark for CPAP follow up.   Chief complaint according to patient : " I need supplies"   Mrs. Cheryl Castillo was first evaluated in this office on 07/05/2008 and a diagnostic polysomnography. At the time she was referred by Dr. Horald Pollen. She reached an apnea hypotony index of 20.7, during REM her AHI was 34.100 supine sleep position her AHI rose to 41.3. She did not have prolonged desaturations of oxygen she was a severe snorer, she had PLM related arousals at 3.9 per hour of sleep. She return for CPAP titration and the pressure that was finally prescribed was 8 cm water. Under the setting she reached an AHI of 2.6 but his sleep is still somewhat fragmented during her stay in the sleep lab. She continued to have some periodic limb movements but no significant oxygen desaturations. At the time a Caryn [employer] OPTi life mask in [place] was used.  The machine is made meanwhile 62 years old and the software is no longer supported. Cheryl Castillo also reports that sometimes the machine wouldn't start even after she repeatedly pushed the stop button and is no longer reliable. She is therefore in need of a new CPAP machine. She also would like to have a trouble friendly smaller machine.  Sleep habits are as follows: Her bedtime is usually 11 PM, she falls asleep promptly she is adhering to the CPAP compliantly. She enjoys using the CPAP. She will sleep 8 hours through the night. Sometimes she will have a bathroom break but not every night. She is no longer snoring while on  this machine, there have been no reported or witnessed apneas. CPAP has changed her life she states. When she wakes up in the morning she is usually refreshed and restored she does not complain of headaches or dry mouth nor epistaxis.  Social history:  Dr Jimmey Ralph  DPM is her husband.  She has 2 adult sons, 17 and 69.   Review of Systems: Out of a complete 14 system review, the patient complains of only the following symptoms, and all other reviewed systems are negative.  Epworth score 6 , Fatigue severity score11  , depression score 0/15    Social History   Social History  . Marital Status: Married    Spouse Name: N/A  . Number of Children: N/A  . Years of Education: N/A   Occupational History  . Not on file.   Social History Main Topics  . Smoking status: Never Smoker   . Smokeless tobacco: Never Used  . Alcohol Use: Not on file  . Drug Use: No  . Sexual Activity: Yes   Other Topics Concern  . Not on file   Social History Narrative    Family History  Problem Relation Age of Onset  . Diabetes Mother   . Arthritis Mother   . Cancer Father   . Cancer Brother     skin cancer    Past Medical History  Diagnosis Date  . Allergy   .  Cataract   . Asthma   . Depression     Past Surgical History  Procedure Laterality Date  . Cataract extraction    . Appendectomy    . Knee surgery      Current Outpatient Prescriptions  Medication Sig Dispense Refill  . albuterol (PROVENTIL HFA;VENTOLIN HFA) 108 (90 BASE) MCG/ACT inhaler Inhale 2 puffs into the lungs every 6 (six) hours as needed. 1 Inhaler 2  . budesonide-formoterol (SYMBICORT) 160-4.5 MCG/ACT inhaler Inhale 2 puffs into the lungs 2 (two) times daily. 1 Inhaler 3  . celecoxib (CELEBREX) 100 MG capsule Take 1 capsule (100 mg total) by mouth 2 (two) times daily. 30 capsule 0  . ciprofloxacin (CIPRO) 500 MG tablet Take 1 tablet (500 mg total) by mouth 2 (two) times daily. 20 tablet 0  . cycloSPORINE (RESTASIS) 0.05  % ophthalmic emulsion 1 drop 2 (two) times daily.    [facility] PRESCRIPTION MEDICATION 0.5 patches. Combi Patch 50/140 change patch twice weekly     No current facility-administered medications for this visit.    Allergies as of 07/18/2015 - Review Complete 07/18/2015  Allergen Reaction Noted  . Levaquin [levofloxacin]  12/03/2011    Vitals: BP 108/72 mmHg  Pulse 76  Resp 20  Ht 5\' 3"  (1.6 m)  Wt 161 lb (73.029 kg)  BMI 28.53 kg/m2 Last Weight:  Wt Readings from Last 1 Encounters:  07/18/15 161 lb (73.029 kg)   [id number] mass index is 28.53 kg/(m^2).     Last Height:   Ht Readings from Last 1 Encounters:  07/18/15 5\' 3"  (1.6 m)    Physical exam:  General: The patient is awake, alert and appears not in acute distress. The patient is well groomed. Head: Normocephalic, atraumatic. Neck is supple. Mallampati 3,  neck circumference: 15.5 . Nasal airflow, TMJ click is not evident . Retrognathia is not seen.  Cardiovascular:  Regular rate and rhythm, without  murmurs or carotid bruit, and without distended neck veins. Respiratory: Lungs are clear to auscultation. Skin:  Without evidence of edema, or rash Trunk: BMI is elevated . The patient's posture is erect   Neurologic exam : The patient is awake and alert, oriented to place and time.   Memory subjective  described as intact.  ty appears normal.  Speech is fluent,  without dysarthria, dysphonia or aphasia.  Mood and affect are appropriate.  Cranial nerves: Pupils are equal and briskly reactive to light. Hearing to finger rub intact.   Facial sensation intact to fine touch.  Facial motor strength is symmetric and tongue and uvula move midline. Shoulder shrug was symmetrical.   Motor exam:   Normal tone, muscle bulk and symmetric strength in all extremities.  Sensory:  Fine touch, pinprick and vibration were tested in all extremities. Proprioception tested in the upper extremities was normal.  Coordination: Rapid alternating  movements in the fingers/hands was normal. Finger-to-nose maneuver  normal without evidence of ataxia, dysmetria or tremor.  Gait and station: Patient walks without assistive device and is able unassisted to climb up to the exam table. Strength within normal limits.  Stance is stable and normal.   Deep tendon reflexes: in the  upper and lower extremities are symmetric and intact. Babinski maneuver response is  downgoing.  The patient was advised of the nature of the diagnosed sleep disorder , the treatment options and risks for general a health and wellness arising from not treating the condition.  I spent more than 30 minutes of face to face time  with the patient. Greater than 50% of time was spent in counseling and coordination of care. We have discussed the diagnosis and differential and I answered the patient's questions.     Assessment:  After physical and neurologic examination, review of laboratory studies,  Personal review of imaging studies, reports of other /same  Imaging studies ,  Results of polysomnography/ neurophysiology testing and pre-existing records as far as provided in visit., my assessment is   1) Cheryl Castillo's current machine is no longer reliably working. I will repeat a home sleep test with her, which I hopefully find covered by her health insurance. Based on the results I will either order an auto titration or else. At this time it is not necessary for Cheryl Castillo to return to the sleep lab for an attended split-night polysomnography.  Plan:  Treatment plan and additional workup :  HST and order for new CPAP after that.  Does not longer want [facility] ,  DME= change to [facility]ri[facility] Damione Robideau MD  07/18/2015   CC: Lanice Shirts, Md [address] [place], [place] [address]

## 2015-07-18 NOTE — Addendum Note (Signed)
Addended by: Larey Seat on: 07/18/2015 04:52 PM   Modules accepted: Orders

## 2015-07-29 ENCOUNTER — Telehealth: Payer: Self-pay | Admitting: Neurology

## 2015-07-29 DIAGNOSIS — G4733 Obstructive sleep apnea (adult) (pediatric): Secondary | ICD-10-CM

## 2015-07-29 DIAGNOSIS — R0683 Snoring: Secondary | ICD-10-CM

## 2015-07-29 DIAGNOSIS — Z9989 Dependence on other enabling machines and devices: Secondary | ICD-10-CM

## 2015-07-29 NOTE — Telephone Encounter (Signed)
HST study will require a prior authorization.  IN Lab sleep study will not require an authorization.  Can I get an order for an in lab sleep study?

## 2015-07-30 NOTE — Telephone Encounter (Signed)
The patient requested HST- can you let her know that it is insurance making [place] change orders? She would much like the HST - and I will help work on pre authorization for her.

## 2015-08-19 ENCOUNTER — Ambulatory Visit (INDEPENDENT_AMBULATORY_CARE_PROVIDER_SITE_OTHER): Payer: BLUE CROSS/BLUE SHIELD | Admitting: Neurology

## 2015-08-19 DIAGNOSIS — G4710 Hypersomnia, unspecified: Secondary | ICD-10-CM | POA: Diagnosis not present

## 2015-08-19 DIAGNOSIS — G479 Sleep disorder, unspecified: Secondary | ICD-10-CM

## 2015-08-19 DIAGNOSIS — R5383 Other fatigue: Secondary | ICD-10-CM

## 2015-08-19 DIAGNOSIS — R0683 Snoring: Secondary | ICD-10-CM

## 2015-08-28 ENCOUNTER — Telehealth: Payer: Self-pay | Admitting: Neurology

## 2015-08-28 DIAGNOSIS — G4733 Obstructive sleep apnea (adult) (pediatric): Secondary | ICD-10-CM

## 2015-08-28 NOTE — Telephone Encounter (Signed)
Pt called about sleep study. Please call

## 2015-08-29 NOTE — Telephone Encounter (Signed)
I spoke to pt and advised her that I do not have her sleep study results at this time since Dr. Brett Fairy was out of the office for the pat 2 weeks. I will ask Dr. Brett Fairy to read her sleep stud ASAP and I will call the pt as soon as I have it. Pt verbalized understanding.

## 2015-09-03 ENCOUNTER — Other Ambulatory Visit: Payer: Self-pay

## 2015-09-03 DIAGNOSIS — G4733 Obstructive sleep apnea (adult) (pediatric): Secondary | ICD-10-CM

## 2015-09-03 DIAGNOSIS — Z9989 Dependence on other enabling machines and devices: Secondary | ICD-10-CM

## 2015-09-03 NOTE — Telephone Encounter (Signed)
i am OK with setting to 8 cm, but I think she may not need one at all.[facility] Kitchen[facility] Kitchen[facility] Kitchen

## 2015-09-03 NOTE — Telephone Encounter (Signed)
Pt called in to get result from sleep study . She needs new supplies and can not order with out the results and rx. Please call to advise.

## 2015-09-03 NOTE — Addendum Note (Signed)
Addended by: Lester  A on: 09/03/2015 11:47 AM   Modules accepted: Orders

## 2015-09-03 NOTE — Telephone Encounter (Signed)
I spoke to pt.  I advised her that her sleep study reveals supine dependent, very mild osa. The respiratory component of RERAS and snoring caused some sleep interruptions. Snoring treatment is advised. PAP therapy is one option and and dental device is another. Pt states that she wants to keep using her CPAP. I advised weight loss and positional therapy. Pt says that her current CPAP is 62 years old and set at 8 cm H2O. She does not want to return for a cpap titration and is wondering if Dr. Brett Fairy would be agreeable to ordering a new cpap set at 8 cm H2O. Pt wants the order sent to [facility]. Pt verbalized understanding of results. Pt had no questions at this time but was encouraged to call back if questions arise. A copy of pt's sleep study was faxed to Dr. Coralyn Mark.  Dr. Brett Fairy, are you agreeable for an order to set pt's new cpap to 8 cm H2O? This is what her current cpap is set at. Pt does not wish to return for a cpap titration.

## 2015-09-03 NOTE — Telephone Encounter (Signed)
I spoke to pt. I advised her that Dr. Brett Fairy is agreeable to setting her CPAP to 8 cm H2O but that she doesn't think the pt may need one at all. Pt says that she sleeps so much better while on cpap and the night of the sleep study (without her cpap) she did not sleep well. I advised her that I need to make a follow up with Dr. Brett Fairy for insurance purposes, but pt declined to make this appt at this time. Will send to [facility].

## 2015-09-12 DIAGNOSIS — G4733 Obstructive sleep apnea (adult) (pediatric): Secondary | ICD-10-CM | POA: Diagnosis not present

## 2015-10-03 ENCOUNTER — Encounter: Payer: Self-pay | Admitting: Family Medicine

## 2015-10-03 ENCOUNTER — Ambulatory Visit (INDEPENDENT_AMBULATORY_CARE_PROVIDER_SITE_OTHER): Payer: BLUE CROSS/BLUE SHIELD | Admitting: Family Medicine

## 2015-10-03 DIAGNOSIS — E663 Overweight: Secondary | ICD-10-CM | POA: Diagnosis not present

## 2015-10-03 DIAGNOSIS — M8580 Other specified disorders of bone density and structure, unspecified site: Secondary | ICD-10-CM | POA: Diagnosis not present

## 2015-10-03 NOTE — Progress Notes (Signed)
Medical Nutrition Therapy:  Appt start time: [id number] end time:  1630.  Assessment:  Primary concerns today: Weight management.   Meidcal history includes osteopenia, GERD, and h/o IBS.  [name] has been going to [employer] for almost 7 years.  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  She feels she is doing much better with respect to her emotional eating, but still struggles with body image.  [name] lost to 148 lb in 2012 after having pneumonia.  She loved being that weight, but cannot manage to maintain.  [name] is especially attracted to carb foods, but tries to avoid them at lunch, and to minimize generally.    About 7 years ago, she had mold in her home, for which she saw [name], who recommended she eliminate dairy and lower carb's.  She endorses feeling better when off of dairy.  She now limits dairy to 2 T of half and half and 1-2 T feta chs on salads occasionally.  [name] has been tracking intake on MyFitnessPal.    Learning Readiness: Change in progress  Usual eating pattern includes 3 meals and 1-2 snacks per day. Frequent foods and beverages include snacks of fresh fruit and popcorn or peanuts and choc chips (~1 oz each); oatmeal with alm milk, chia, and berries or alternate breakfast of pintos, barley, & veg's; 1 c decaf with half&half, 1 Swt 'n Low; chx, salad, fish, seltzer/Crystal Light drink mix, water.  Avoided foods include dairy, pasta, most refined sugars and flours, fast food, most fried foods.   Usual physical activity includes walking about 70 min ~1 X wk. Up till Feb 2017, she was walking 3 X wk, but hurt her foot in Feb, and is just getting back into walking now.  She is also doing yoga once a week, and weight training (1 X wk at most).  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    24-hr recall: (Up at 7 AM) B (8 AM)-   1 c decaf with half&half, 1 Swt 'n Low, 1/2 c oatmeal, 1 c  soymilk, 1 tsp chia sds, 2 c figs  400 Snk ( AM)-   --- L (1 PM)-  1 large apple, veg bean soup          250 Snk (5 PM)-  Peanuts            160 D (8 PM)-  3 oz chx, 1/3 c barley, sauteed veg's         330 Snk ( PM)-  --- Typical day? No. More typical lunch is salad with chx or beans, barley, and veg's.    Progress Towards Goal(s):  In progress.   Nutritional Diagnosis:  NB-2.1 Physical inactivity As related to energy balance.  As evidenced by current relative inactivity due to time constraints.  Intervention:  Nutritional counseling   Handouts given during visit include:  AVS  Embrace video  Emotional Eating pdf and link to [phone] video  Demonstrated degree of understanding via:  Teach Back  Barriers to learning/adherence to lifestyle change: Long-standing behaviors linking food and emotion.    Monitoring/Evaluation:  Dietary intake, exercise, and body weight in 8 week(s).

## 2015-10-03 NOTE — Patient Instructions (Addendum)
-   Veg's are good at any time, but may not be necessary at all three meals.    - You DO want 3 REAL meals/day.  A real meal includes some protein, some starch, and some veg's and/or fruit.    - Eat at least 3 REAL meals and 1-2 snacks per day.  Aim for no more than 5 hours between eating.  Eat breakfast within one hour of getting up.   - Increase exercise as you are able, including strengthening exercises.  This will be especially important for bone health.  - Personal Trainer:  Van Clines; [phone].    - Watch the Embrace video.  Furniture conservator/restorer with your thoughts after you watch this.    - Watch the video [phone] 30-minute Explanation:  [email or web address].com  - Review the pdf I've emailed you about emotional eating after watching the video.

## 2015-10-09 DIAGNOSIS — H524 Presbyopia: Secondary | ICD-10-CM | POA: Diagnosis not present

## 2015-10-09 DIAGNOSIS — H04123 Dry eye syndrome of bilateral lacrimal glands: Secondary | ICD-10-CM | POA: Diagnosis not present

## 2015-10-09 DIAGNOSIS — H40013 Open angle with borderline findings, low risk, bilateral: Secondary | ICD-10-CM | POA: Diagnosis not present

## 2015-10-09 DIAGNOSIS — H01003 Unspecified blepharitis right eye, unspecified eyelid: Secondary | ICD-10-CM | POA: Diagnosis not present

## 2015-10-13 DIAGNOSIS — G4733 Obstructive sleep apnea (adult) (pediatric): Secondary | ICD-10-CM | POA: Diagnosis not present

## 2015-11-13 DIAGNOSIS — G4733 Obstructive sleep apnea (adult) (pediatric): Secondary | ICD-10-CM | POA: Diagnosis not present

## 2015-11-14 DIAGNOSIS — N760 Acute vaginitis: Secondary | ICD-10-CM | POA: Diagnosis not present

## 2015-11-14 DIAGNOSIS — N390 Urinary tract infection, site not specified: Secondary | ICD-10-CM | POA: Diagnosis not present

## 2015-12-03 ENCOUNTER — Encounter: Payer: Self-pay | Admitting: Family Medicine

## 2015-12-03 ENCOUNTER — Ambulatory Visit (INDEPENDENT_AMBULATORY_CARE_PROVIDER_SITE_OTHER): Payer: BLUE CROSS/BLUE SHIELD | Admitting: Family Medicine

## 2015-12-03 DIAGNOSIS — E663 Overweight: Secondary | ICD-10-CM | POA: Diagnosis not present

## 2015-12-03 DIAGNOSIS — M8580 Other specified disorders of bone density and structure, unspecified site: Secondary | ICD-10-CM

## 2015-12-03 NOTE — Progress Notes (Signed)
Medical Nutrition Therapy:  Appt start time: 1100 end time:  1630.  Assessment:  Primary concerns today: Weight management.   Cheryl Castillo watched the documentary about body image, "Embrace," which she said she got a lot out of.  She has realized that she has used food most of her life to make her feel better.  She also has become aware of an anxiety she has around food and weight.  She has been trying to pay more attn to satiety, and eating with mindfulness.    Cheryl Castillo has started using a personal trainer her husband uses at [employer].  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  She has only been doing minimal strength exercises a couple times a week (given by the personal trainer) to strengthen her back.    Cheryl Castillo did not do the Urge process precisely as instructed, but she has used parts of it in dealing with uncomfortable emotions.  I emphasized to her the importance of getting to the thought driving the emotions, and she was receptive to giving this more of a real try.  She has watched the Urge video and pdf document.    Progress Towards Goal(s):  In progress.   Nutritional Diagnosis:  No significant progress on NB-2.1 Physical inactivity As related to energy balance.  As evidenced by current relative inactivity due to time constraints.  Intervention:  Nutritional counseling   Handouts given during visit include:  AVS  Demonstrated degree of understanding via:  Teach Back  Barriers to learning/adherence to lifestyle change: Long-standing behaviors linking food and emotion.    Monitoring/Evaluation:  Dietary intake, exercise, and body weight in 5 week(s).

## 2015-12-03 NOTE — Patient Instructions (Addendum)
-   Check out self-compassion.org.  Take the quiz.    - Peruse the website Ellynsatterinstitute.org regarding the "feeding relationship."  - Using the Urge process:  Identify the specific THOUGHT that triggers your FEELING(s).    - WRITE down your answers.  Bring to follow-up appt.    - After Nov 7:  Gym workout 2-3 X wk; yoga once a week; walking 2-3 X wk.

## 2015-12-04 DIAGNOSIS — L821 Other seborrheic keratosis: Secondary | ICD-10-CM | POA: Diagnosis not present

## 2015-12-04 DIAGNOSIS — L920 Granuloma annulare: Secondary | ICD-10-CM | POA: Diagnosis not present

## 2015-12-04 DIAGNOSIS — D2271 Melanocytic nevi of right lower limb, including hip: Secondary | ICD-10-CM | POA: Diagnosis not present

## 2015-12-04 DIAGNOSIS — D2230 Melanocytic nevi of unspecified part of face: Secondary | ICD-10-CM | POA: Diagnosis not present

## 2015-12-04 DIAGNOSIS — Z23 Encounter for immunization: Secondary | ICD-10-CM | POA: Diagnosis not present

## 2015-12-04 DIAGNOSIS — D2262 Melanocytic nevi of left upper limb, including shoulder: Secondary | ICD-10-CM | POA: Diagnosis not present

## 2015-12-13 DIAGNOSIS — G4733 Obstructive sleep apnea (adult) (pediatric): Secondary | ICD-10-CM | POA: Diagnosis not present

## 2016-01-07 ENCOUNTER — Ambulatory Visit (INDEPENDENT_AMBULATORY_CARE_PROVIDER_SITE_OTHER): Payer: BLUE CROSS/BLUE SHIELD | Admitting: Family Medicine

## 2016-01-07 DIAGNOSIS — M8580 Other specified disorders of bone density and structure, unspecified site: Secondary | ICD-10-CM

## 2016-01-07 DIAGNOSIS — E663 Overweight: Secondary | ICD-10-CM

## 2016-01-07 NOTE — Progress Notes (Signed)
Medical Nutrition Therapy:  Appt start time: [id number] end time:  1630.  Assessment:  Primary concerns today: Weight management.   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  She did keep a record of emotions, and she looked at the website Self-compassion.org, but did not see the quiz to take to evaluate her degree of self-compassion.    We did not get to discussion of her use of the Urge process and becoming more aware of emotions as related to food, b/c Cheryl Castillo had many specific questions, including specific recommendations for weight loss.  She would still like to lose at least 10 lb, concerned that this is, in fact, healthiest for her.  Cheryl Castillo has a pretty large body frame, and standard BMI range may underestimate what weight range is appropriate for her.    24-hr recall suggests intake of 1250-1300 kcal:  (Up at 7 AM) B (8 AM)-  1/2 c bean&barley, 1/2 c Ital sauteed zucch, on, mushrms, sundried tomatoes, 1/4 c egg whites (50 kcal), 1 c coffee, 2 T cream (40 kcal)  Snk ( AM)-  --- L ( PM)-  2 c chx broth, 1 c bean&barley (252 kcal), 2 c club soda w/ fiber & Crystal Light Snk ( PM)-  --- D ( PM)-  BLUE APRON chx, collards, sauteed carrots, goat chs, farrot (690 kcal), club soda Snk ( PM)-  --- Typical day? Yes.    Progress Towards Goal(s):  In progress.   Nutritional Diagnosis:  No significant progress on NB-2.1 Physical inactivity As related to energy balance.  As evidenced by continued relative inactivity due to time constraints.  Intervention:  Nutritional counseling   Handouts given during visit include:  AVS  Demonstrated degree of understanding via:  Teach Back  Barriers to learning/adherence to lifestyle change: Long-standing behaviors linking food and emotion.    Monitoring/Evaluation:  Dietary intake, exercise, and body weight in 8 week(s).

## 2016-01-07 NOTE — Patient Instructions (Addendum)
-   Make sure you are getting a good portion of protein at each meal.   - Aim for a daily total of at least 4 cups of veg's (count raw greens as half the portion of cooked veg's.) - Include carb at each meal, but minimal amount most of the time.  Choose the lower glycemic carb's, e.g., sweet potato, brown rice, barley, quinoa, whole-wheat pasta, beets, winter squash.   - Fats:  EVOO, (real) butter, avocado, nuts and seeds.    - Physical activity:  Make this a priority, including some aerobic activity (~30 min 5 X wk) and strength training (20 min 2-3 X wk).    - Also: [employer] High-intensity interval training, and read the emailed article.   - Watch the video (link also emailed) re. exercise.   For new insurance in 2018, here are questions to ask your insurance company to determine coverage of nutrition services:  1. Does my policy cover MEDICAL NUTRITION THERAPY? This service is CPT code [phone] (initial appointment) or [phone] (all subsequent appointments).   2. If YES, is coverage limited to certain diagnoses?   [facility] Kitchen Which ones?   . What is my co-pay? . Am I limited to a certain number of visits per year?    3. Do I need a physician's referral?  4. If NO, is NUTRITION COUNSELING covered under  Preventive Services (CPT code [phone])? Marland Kitchen Am I limited to a certain number of visits per year?

## 2016-01-25 ENCOUNTER — Ambulatory Visit (INDEPENDENT_AMBULATORY_CARE_PROVIDER_SITE_OTHER): Payer: BLUE CROSS/BLUE SHIELD | Admitting: Family Medicine

## 2016-01-25 VITALS — BP 110/68 | HR 75 | Temp 99.1°F | Resp 18 | Ht 62.0 in | Wt 161.0 lb

## 2016-01-25 DIAGNOSIS — R350 Frequency of micturition: Secondary | ICD-10-CM | POA: Diagnosis not present

## 2016-01-25 DIAGNOSIS — N3001 Acute cystitis with hematuria: Secondary | ICD-10-CM | POA: Diagnosis not present

## 2016-01-25 LAB — POC MICROSCOPIC URINALYSIS (UMFC)

## 2016-01-25 LAB — POCT URINALYSIS DIP (MANUAL ENTRY)
BILIRUBIN UA: NEGATIVE
BILIRUBIN UA: NEGATIVE
GLUCOSE UA: NEGATIVE
NITRITE UA: NEGATIVE
PH UA: 8.5
Protein Ur, POC: 30 — AB
Spec Grav, UA: 1.015
Urobilinogen, UA: 0.2

## 2016-01-25 MED ORDER — NITROFURANTOIN MONOHYD MACRO 100 MG PO CAPS: 100.0000 mg | ORAL_CAPSULE | Freq: Two times a day (BID) | ORAL | 0 refills | Status: DC

## 2016-01-25 NOTE — Progress Notes (Signed)
Patient ID: Cheryl Castillo, female    DOB: 1955-09-01, 60 y.o.   MRN: [id number]  PCP: Tawanna Solo, MD  Chief Complaint  Patient presents with  . Cystitis  . Urinary Frequency    Subjective:   HPI 60 year old female presents for evaluation of dysuria x this morning. Pt is familiar to [facility] although this is my first encounter with the patient. She reports two days ago experiencing low back pain bilaterally which resolved without treatment. Report awaking this morning with initial void experienced burning sensation, next void occurred less than hour later with increase burning, and by the third void she concluded that she needed to be evaluated for urinary tract infection.   Social History   Social History  . Marital status: Married    Spouse name: N/A  . Number of children: N/A  . Years of education: N/A   Occupational History  . Not on file.   Social History Main Topics  . Smoking status: Never Smoker  . Smokeless tobacco: Never Used  . Alcohol use Not on file  . Drug use: No  . Sexual activity: Yes   Other Topics Concern  . Not on file   Social History Narrative  . No narrative on file    Family History  Problem Relation Age of Onset  . Diabetes Mother   . Arthritis Mother   . Cancer Father   . Cancer Brother     skin cancer   Review of Systems See HPI Patient Active Problem List   Diagnosis Date Noted  . OSA on CPAP 07/18/2015  . Cataract 07/27/2013  . Thyroid nodule  bx done 06/2013 07/05/2013  . Osteopenia 05/08/2013  . Menopause 05/08/2013  . Allergic rhinitis with asthma without status asthmaticus 12/03/2011  . Hx of bacterial pneumonia 12/03/2011  . History of IBS 12/03/2011  . GERD (gastroesophageal reflux disease) 12/03/2011  . Family history of colonic polyps 12/03/2011  . Severe obstructive sleep apnea 12/03/2011  . History of mood disorder 12/03/2011     Prior to Admission medications   Medication Sig Start Date End Date Taking?  Authorizing Provider  albuterol (PROVENTIL HFA;VENTOLIN HFA) 108 (90 BASE) MCG/ACT inhaler Inhale 2 puffs into the lungs every 6 (six) hours as needed. 09/26/14  Yes Roselee Culver, MD  aspirin 81 MG chewable tablet Chew by mouth daily.   Yes Historical Provider, MD  b complex vitamins tablet Take 1 tablet by mouth daily.   Yes Historical Provider, MD  budesonide-formoterol (SYMBICORT) 160-4.5 MCG/ACT inhaler Inhale 2 puffs into the lungs 2 (two) times daily. 09/26/14  Yes Roselee Culver, MD  Calcium-Phosphorus-Vitamin D (CALCIUM GUMMIES PO) Take 2 tablets by mouth.   Yes Historical Provider, MD  celecoxib (CELEBREX) 100 MG capsule Take 1 capsule (100 mg total) by mouth 2 (two) times daily. 01/31/14  Yes Lanice Shirts, MD  cholecalciferol (VITAMIN D) 1000 units tablet Take 5,000 Units by mouth daily.   Yes Historical Provider, MD  cycloSPORINE (RESTASIS) 0.05 % ophthalmic emulsion 1 drop 2 (two) times daily.   Yes Historical Provider, MD  famotidine (PEPCID) 20 MG tablet Take 20 mg by mouth 2 (two) times daily.   Yes Historical Provider, MD  Multiple Vitamin (MULTIVITAMIN WITH MINERALS) TABS tablet Take 1 tablet by mouth daily.   Yes Historical Provider, MD  Omega-3 Fatty Acids (OMEGA-3 2100 PO) Take by mouth.   Yes Historical Provider, MD  PRESCRIPTION MEDICATION 0.5 patches. Combi Patch 50/140 change patch  twice weekly   Yes Historical Provider, MD  Probiotic Product (PROBIOTIC FORMULA PO) Take by mouth.   Yes Historical Provider, MD  PSYLLIUM PO Take by mouth.   Yes Historical Provider, MD     Allergies  Allergen Reactions  . Levaquin [Levofloxacin]     Joint Pain       Objective:  Physical Exam  Constitutional: She appears well-developed and well-nourished.  HENT:  Head: Normocephalic and atraumatic.  Right Ear: External ear normal.  Left Ear: External ear normal.  Nose: Nose normal.  Mouth/Throat: Oropharynx is clear and moist.  Eyes: Conjunctivae are normal.  Pupils are equal, round, and reactive to light.  Neck: Normal range of motion. Neck supple.  Cardiovascular: Normal rate, regular rhythm, normal heart sounds and intact distal pulses.   Pulmonary/Chest: Effort normal and breath sounds normal.  Abdominal: Soft. Bowel sounds are normal.  Skin: Skin is warm and dry.  Psychiatric: She has a normal mood and affect. Her behavior is normal. Judgment and thought content normal.   Vitals:   01/25/16 1028  BP: 110/68  Pulse: 75  Resp: 18  Temp: 99.1 F (37.3 C)     Assessment & Plan:  1. Acute cystitis with hematuria - POCT Microscopic Urinalysis ([facility]) - POCT urinalysis dipstick - Urine culture-pending  Plan: Nitrofurantoin (Macrobid) 100 mg twice daily x 10 days  Return for follow-up if symptoms worsen or do not resolve.  Carroll Sage. [name], MSN, FNP-C [facility]

## 2016-01-25 NOTE — Patient Instructions (Addendum)
Take Macrobid 100 mg twice daily for 10 days. If symptoms worsen or do not improve, return for care.  Cheryl Castillo. [name], MSN, FNP-C [facility]    IF you received an x-ray today, you will receive an invoice from [facility]. Please contact [facility] at [phone] with questions or concerns regarding your invoice.   IF you received labwork today, you will receive an invoice from [employer]. Please contact [facility] at [phone] with questions or concerns regarding your invoice.   Our billing staff will not be able to assist you with questions regarding bills from these companies.  You will be contacted with the lab results as soon as they are available. The fastest way to get your results is to activate your My Chart account. Instructions are located on the last page of this paperwork. If you have not heard from [place] regarding the results in 2 weeks, please contact this office.    Urinary Tract Infection, Adult A urinary tract infection (UTI) is an infection of any part of the urinary tract, which includes the kidneys, ureters, bladder, and urethra. These organs make, store, and get rid of urine in the body. UTI can be a bladder infection (cystitis) or kidney infection (pyelonephritis). What are the causes? This infection may be caused by fungi, viruses, or bacteria. Bacteria are the most common cause of UTIs. This condition can also be caused by repeated incomplete emptying of the bladder during urination. What increases the risk? This condition is more likely to develop if:  You ignore your need to urinate or hold urine for long periods of time.  You do not empty your bladder completely during urination.  You wipe back to front after urinating or having a bowel movement, if you are female.  You are uncircumcised, if you are female.  You are constipated.  You have a urinary catheter that  stays in place (indwelling).  You have a weak defense (immune) system.  You have a medical condition that affects your bowels, kidneys, or bladder.  You have diabetes.  You take antibiotic medicines frequently or for long periods of time, and the antibiotics no longer work well against certain types of infections (antibiotic resistance).  You take medicines that irritate your urinary tract.  You are exposed to chemicals that irritate your urinary tract.  You are female. What are the signs or symptoms? Symptoms of this condition include:  Fever.  Frequent urination or passing small amounts of urine frequently.  Needing to urinate urgently.  Pain or burning with urination.  Urine that smells bad or unusual.  Cloudy urine.  Pain in the lower abdomen or back.  Trouble urinating.  Blood in the urine.  Vomiting or being less hungry than normal.  Diarrhea or abdominal pain.  Vaginal discharge, if you are female. How is this diagnosed? This condition is diagnosed with a medical history and physical exam. You will also need to provide a urine sample to test your urine. Other tests may be done, including:  Blood tests.  Sexually transmitted disease (STD) testing. If you have had more than one UTI, a cystoscopy or imaging studies may be done to determine the cause of the infections. How is this treated? Treatment for this condition often includes a combination of two or more of the following:  Antibiotic medicine.  Other medicines to treat less common causes of UTI.  Over-the-counter medicines to treat pain.  Drinking enough water to  stay hydrated. Follow these instructions at home:  Take over-the-counter and prescription medicines only as told by your health care provider.  If you were prescribed an antibiotic, take it as told by your health care provider. Do not stop taking the antibiotic even if you start to feel better.  Avoid alcohol, caffeine, tea, and  carbonated beverages. They can irritate your bladder.  Drink enough fluid to keep your urine clear or pale yellow.  Keep all follow-up visits as told by your health care provider. This is important.  Make sure to:  Empty your bladder often and completely. Do not hold urine for long periods of time.  Empty your bladder before and after sex.  Wipe from front to back after a bowel movement if you are female. Use each tissue one time when you wipe. Contact a health care provider if:  You have back pain.  You have a fever.  You feel nauseous or vomit.  Your symptoms do not get better after 3 days.  Your symptoms go away and then return. Get help right away if:  You have severe back pain or lower abdominal pain.  You are vomiting and cannot keep down any medicines or water. This information is not intended to replace advice given to you by your health care provider. Make sure you discuss any questions you have with your health care provider. Document Released: 11/12/2004 Document Revised: 07/17/2015 Document Reviewed: 12/24/2014 Elsevier Interactive Patient Education  2017 [employer].

## 2016-01-30 LAB — URINE CULTURE

## 2016-02-08 ENCOUNTER — Ambulatory Visit (INDEPENDENT_AMBULATORY_CARE_PROVIDER_SITE_OTHER): Payer: BLUE CROSS/BLUE SHIELD | Admitting: Physician Assistant

## 2016-02-08 VITALS — BP 104/67 | HR 89 | Temp 98.7°F | Resp 17 | Ht 62.0 in

## 2016-02-08 DIAGNOSIS — B9789 Other viral agents as the cause of diseases classified elsewhere: Secondary | ICD-10-CM

## 2016-02-08 DIAGNOSIS — J069 Acute upper respiratory infection, unspecified: Secondary | ICD-10-CM

## 2016-02-08 MED ORDER — AZITHROMYCIN 250 MG PO TABS: ORAL_TABLET | ORAL | 0 refills | Status: AC

## 2016-02-08 MED ORDER — ALBUTEROL SULFATE HFA 108 (90 BASE) MCG/ACT IN AERS: 2.0000 | INHALATION_SPRAY | Freq: Four times a day (QID) | RESPIRATORY_TRACT | 2 refills | Status: AC | PRN

## 2016-02-08 NOTE — Addendum Note (Signed)
Addended by: Mancel Bale on: 02/08/2016 11:57 AM   Modules accepted: Orders

## 2016-02-08 NOTE — Progress Notes (Signed)
Cheryl Castillo  MRN: [id number] DOB: January 26, 1956  Subjective:  Pt presents to clinic with 4 day h/o cold symptoms.  Started with a tickle in her throat.  Pervious week some nasal drainage and she used a netti pot for that and it is much better.  She is using her Asmanex and ventolin which helps relax some of the spasms that she is having.  No sick contacts.  Review of Systems  Constitutional: Positive for fever (low grade at onset). Negative for chills.  HENT: Positive for postnasal drip (slightly this am only). Negative for congestion.   Respiratory: Positive for cough (yellow - all day) and wheezing. Negative for shortness of breath.        H/o asthma, nonsmoker  Gastrointestinal: Negative.   Musculoskeletal: Negative for myalgias.  Neurological: Positive for headaches.  Psychiatric/Behavioral: Positive for sleep disturbance (2nd to cough).    Patient Active Problem List   Diagnosis Date Noted  . OSA on CPAP 07/18/2015  . Cataract 07/27/2013  . Thyroid nodule  bx done 06/2013 07/05/2013  . Osteopenia 05/08/2013  . Menopause 05/08/2013  . Allergic rhinitis with asthma without status asthmaticus 12/03/2011  . Hx of bacterial pneumonia 12/03/2011  . History of IBS 12/03/2011  . GERD (gastroesophageal reflux disease) 12/03/2011  . Family history of colonic polyps 12/03/2011  . Severe obstructive sleep apnea 12/03/2011  . History of mood disorder 12/03/2011    Current Outpatient Prescriptions on File Prior to Visit  Medication Sig Dispense Refill  . albuterol (PROVENTIL HFA;VENTOLIN HFA) 108 (90 BASE) MCG/ACT inhaler Inhale 2 puffs into the lungs every 6 (six) hours as needed. 1 Inhaler 2  . aspirin 81 MG chewable tablet Chew by mouth daily.    Marland Kitchen b complex vitamins tablet Take 1 tablet by mouth daily.    . Calcium-Phosphorus-Vitamin D (CALCIUM GUMMIES PO) Take 2 tablets by mouth.    . celecoxib (CELEBREX) 100 MG capsule Take 1 capsule (100 mg total) by mouth 2 (two) times daily.  30 capsule 0  . cholecalciferol (VITAMIN D) 1000 units tablet Take 5,000 Units by mouth daily.    . cycloSPORINE (RESTASIS) 0.05 % ophthalmic emulsion 1 drop 2 (two) times daily.    . famotidine (PEPCID) 20 MG tablet Take 20 mg by mouth 2 (two) times daily.    . Multiple Vitamin (MULTIVITAMIN WITH MINERALS) TABS tablet Take 1 tablet by mouth daily.    . Omega-3 Fatty Acids (OMEGA-3 2100 PO) Take by mouth.    Marland Kitchen PRESCRIPTION MEDICATION 0.5 patches. Combi Patch 50/140 change patch twice weekly    . Probiotic Product (PROBIOTIC FORMULA PO) Take by mouth.    . PSYLLIUM PO Take by mouth.    . budesonide-formoterol (SYMBICORT) 160-4.5 MCG/ACT inhaler Inhale 2 puffs into the lungs 2 (two) times daily. (Patient not taking: Reported on 02/08/2016) 1 Inhaler 3   No current facility-administered medications on file prior to visit.     Allergies  Allergen Reactions  . Levaquin [Levofloxacin]     Joint Pain    Pt patients past, family and social history were reviewed and updated.   Objective:  BP 104/67 (BP Location: Right Arm, Patient Position: Sitting, Cuff Size: Normal)   Pulse 89   Temp 98.7 F (37.1 C) (Oral)   Resp 17   Ht 5\' 2"  (1.575 m)   SpO2 97%   Physical Exam  Constitutional: She is oriented to person, place, and time and well-developed, well-nourished, and in no distress.  HENT:  Head: Normocephalic and atraumatic.  Right Ear: Hearing, tympanic membrane, external ear and ear canal normal.  Left Ear: Hearing, tympanic membrane, external ear and ear canal normal.  Nose: Nose normal.  Mouth/Throat: Uvula is midline, oropharynx is clear and moist and mucous membranes are normal.  Eyes: Conjunctivae are normal.  Neck: Normal range of motion.  Cardiovascular: Normal rate, regular rhythm and normal heart sounds.   No murmur heard. Pulmonary/Chest: Effort normal and breath sounds normal.  Cough with deep breathing  Neurological: She is alert and oriented to person, place, and  time. Gait normal.  Skin: Skin is warm and dry.  Psychiatric: Mood, memory, affect and judgment normal.  Vitals reviewed.   Assessment and Plan :  Viral URI with cough - Plan: azithromycin (ZITHROMAX Z-PAK) 250 MG tablet - continued symptomatic treatment - fill abx if she is not improved in 2-3 days  Windell Hummingbird PA-C  [facility] 02/08/2016 11:53 AM

## 2016-02-08 NOTE — Patient Instructions (Addendum)
  Please push fluids.  Use your celebrex for fever and body aches.    A humidifier can help especially when the air is dry -if you do not have a humidifier you can boil a pot of water on the stove in your home to help with the dry air.  Nasal saline spray can be helpful to keep the mucus membranes moist and thin the nasal mucus    IF you received an x-ray today, you will receive an invoice from [facility]. Please contact [facility] at [phone] with questions or concerns regarding your invoice.   IF you received labwork today, you will receive an invoice from [facility]. Please contact [facility] at [phone] with questions or concerns regarding your invoice.   Our billing staff will not be able to assist you with questions regarding bills from these companies.  You will be contacted with the lab results as soon as they are available. The fastest way to get your results is to activate your My Chart account. Instructions are located on the last page of this paperwork. If you have not heard from [place] regarding the results in 2 weeks, please contact this office.

## 2016-03-05 ENCOUNTER — Ambulatory Visit: Payer: BLUE CROSS/BLUE SHIELD | Admitting: Family Medicine

## 2016-04-15 DIAGNOSIS — N760 Acute vaginitis: Secondary | ICD-10-CM | POA: Diagnosis not present

## 2016-04-15 DIAGNOSIS — N3000 Acute cystitis without hematuria: Secondary | ICD-10-CM | POA: Diagnosis not present

## 2016-04-15 DIAGNOSIS — R309 Painful micturition, unspecified: Secondary | ICD-10-CM | POA: Diagnosis not present

## 2016-04-16 DIAGNOSIS — M545 Low back pain: Secondary | ICD-10-CM | POA: Diagnosis not present

## 2016-04-16 DIAGNOSIS — M25552 Pain in left hip: Secondary | ICD-10-CM | POA: Diagnosis not present

## 2016-04-16 DIAGNOSIS — M25551 Pain in right hip: Secondary | ICD-10-CM | POA: Diagnosis not present

## 2016-06-03 DIAGNOSIS — Z1231 Encounter for screening mammogram for malignant neoplasm of breast: Secondary | ICD-10-CM | POA: Diagnosis not present

## 2016-06-03 DIAGNOSIS — Z6829 Body mass index (BMI) 29.0-29.9, adult: Secondary | ICD-10-CM | POA: Diagnosis not present

## 2016-06-03 DIAGNOSIS — Z01419 Encounter for gynecological examination (general) (routine) without abnormal findings: Secondary | ICD-10-CM | POA: Diagnosis not present

## 2016-06-18 DIAGNOSIS — F411 Generalized anxiety disorder: Secondary | ICD-10-CM | POA: Diagnosis not present

## 2016-07-08 DIAGNOSIS — H2511 Age-related nuclear cataract, right eye: Secondary | ICD-10-CM | POA: Diagnosis not present

## 2016-07-08 DIAGNOSIS — H35033 Hypertensive retinopathy, bilateral: Secondary | ICD-10-CM | POA: Diagnosis not present

## 2016-07-08 DIAGNOSIS — H40013 Open angle with borderline findings, low risk, bilateral: Secondary | ICD-10-CM | POA: Diagnosis not present

## 2016-07-08 DIAGNOSIS — H40011 Open angle with borderline findings, low risk, right eye: Secondary | ICD-10-CM | POA: Diagnosis not present

## 2016-07-08 DIAGNOSIS — I708 Atherosclerosis of other arteries: Secondary | ICD-10-CM | POA: Diagnosis not present

## 2016-07-08 DIAGNOSIS — H40012 Open angle with borderline findings, low risk, left eye: Secondary | ICD-10-CM | POA: Diagnosis not present

## 2016-07-09 DIAGNOSIS — L570 Actinic keratosis: Secondary | ICD-10-CM | POA: Diagnosis not present

## 2016-07-09 DIAGNOSIS — L821 Other seborrheic keratosis: Secondary | ICD-10-CM | POA: Diagnosis not present

## 2016-07-09 DIAGNOSIS — D2230 Melanocytic nevi of unspecified part of face: Secondary | ICD-10-CM | POA: Diagnosis not present

## 2016-07-09 DIAGNOSIS — Z808 Family history of malignant neoplasm of other organs or systems: Secondary | ICD-10-CM | POA: Diagnosis not present

## 2016-07-09 DIAGNOSIS — D2261 Melanocytic nevi of right upper limb, including shoulder: Secondary | ICD-10-CM | POA: Diagnosis not present

## 2016-07-15 DIAGNOSIS — Z0000 Encounter for general adult medical examination without abnormal findings: Secondary | ICD-10-CM | POA: Diagnosis not present

## 2016-07-15 DIAGNOSIS — N951 Menopausal and female climacteric states: Secondary | ICD-10-CM | POA: Diagnosis not present

## 2016-07-15 DIAGNOSIS — M25551 Pain in right hip: Secondary | ICD-10-CM | POA: Diagnosis not present

## 2016-07-15 DIAGNOSIS — J4520 Mild intermittent asthma, uncomplicated: Secondary | ICD-10-CM | POA: Diagnosis not present

## 2016-07-17 DIAGNOSIS — F411 Generalized anxiety disorder: Secondary | ICD-10-CM | POA: Diagnosis not present

## 2016-07-24 DIAGNOSIS — Z1159 Encounter for screening for other viral diseases: Secondary | ICD-10-CM | POA: Diagnosis not present

## 2016-07-24 DIAGNOSIS — E663 Overweight: Secondary | ICD-10-CM | POA: Diagnosis not present

## 2016-08-14 DIAGNOSIS — F411 Generalized anxiety disorder: Secondary | ICD-10-CM | POA: Diagnosis not present

## 2016-09-14 DIAGNOSIS — Z719 Counseling, unspecified: Secondary | ICD-10-CM | POA: Diagnosis not present

## 2016-09-21 DIAGNOSIS — Z1211 Encounter for screening for malignant neoplasm of colon: Secondary | ICD-10-CM | POA: Diagnosis not present

## 2016-09-21 DIAGNOSIS — K633 Ulcer of intestine: Secondary | ICD-10-CM | POA: Diagnosis not present

## 2016-09-21 DIAGNOSIS — Z8371 Family history of colonic polyps: Secondary | ICD-10-CM | POA: Diagnosis not present

## 2016-09-23 DIAGNOSIS — K633 Ulcer of intestine: Secondary | ICD-10-CM | POA: Diagnosis not present

## 2016-12-07 DIAGNOSIS — M7632 Iliotibial band syndrome, left leg: Secondary | ICD-10-CM | POA: Diagnosis not present

## 2016-12-07 DIAGNOSIS — M7631 Iliotibial band syndrome, right leg: Secondary | ICD-10-CM | POA: Diagnosis not present

## 2016-12-07 DIAGNOSIS — M9905 Segmental and somatic dysfunction of pelvic region: Secondary | ICD-10-CM | POA: Diagnosis not present

## 2016-12-07 DIAGNOSIS — M9903 Segmental and somatic dysfunction of lumbar region: Secondary | ICD-10-CM | POA: Diagnosis not present

## 2017-05-24 DIAGNOSIS — D485 Neoplasm of uncertain behavior of skin: Secondary | ICD-10-CM | POA: Diagnosis not present

## 2017-05-24 DIAGNOSIS — L439 Lichen planus, unspecified: Secondary | ICD-10-CM | POA: Diagnosis not present

## 2017-06-11 DIAGNOSIS — L570 Actinic keratosis: Secondary | ICD-10-CM | POA: Diagnosis not present

## 2017-06-11 DIAGNOSIS — D2230 Melanocytic nevi of unspecified part of face: Secondary | ICD-10-CM | POA: Diagnosis not present

## 2017-06-11 DIAGNOSIS — L821 Other seborrheic keratosis: Secondary | ICD-10-CM | POA: Diagnosis not present

## 2017-06-11 DIAGNOSIS — L820 Inflamed seborrheic keratosis: Secondary | ICD-10-CM | POA: Diagnosis not present

## 2017-06-11 DIAGNOSIS — D2271 Melanocytic nevi of right lower limb, including hip: Secondary | ICD-10-CM | POA: Diagnosis not present

## 2017-06-11 DIAGNOSIS — L719 Rosacea, unspecified: Secondary | ICD-10-CM | POA: Diagnosis not present

## 2017-07-07 DIAGNOSIS — Z803 Family history of malignant neoplasm of breast: Secondary | ICD-10-CM | POA: Diagnosis not present

## 2017-07-07 DIAGNOSIS — Z01419 Encounter for gynecological examination (general) (routine) without abnormal findings: Secondary | ICD-10-CM | POA: Diagnosis not present

## 2017-07-07 DIAGNOSIS — Z1231 Encounter for screening mammogram for malignant neoplasm of breast: Secondary | ICD-10-CM | POA: Diagnosis not present

## 2017-07-07 DIAGNOSIS — Z8042 Family history of malignant neoplasm of prostate: Secondary | ICD-10-CM | POA: Diagnosis not present

## 2017-07-07 DIAGNOSIS — Z8371 Family history of colonic polyps: Secondary | ICD-10-CM | POA: Diagnosis not present

## 2017-07-07 DIAGNOSIS — Z6828 Body mass index (BMI) 28.0-28.9, adult: Secondary | ICD-10-CM | POA: Diagnosis not present

## 2017-07-07 DIAGNOSIS — Z13820 Encounter for screening for osteoporosis: Secondary | ICD-10-CM | POA: Diagnosis not present

## 2017-07-15 DIAGNOSIS — I708 Atherosclerosis of other arteries: Secondary | ICD-10-CM | POA: Diagnosis not present

## 2017-07-15 DIAGNOSIS — H35033 Hypertensive retinopathy, bilateral: Secondary | ICD-10-CM | POA: Diagnosis not present

## 2017-07-15 DIAGNOSIS — H40013 Open angle with borderline findings, low risk, bilateral: Secondary | ICD-10-CM | POA: Diagnosis not present

## 2017-07-15 DIAGNOSIS — H35439 Paving stone degeneration of retina, unspecified eye: Secondary | ICD-10-CM | POA: Diagnosis not present

## 2017-08-25 DIAGNOSIS — Z809 Family history of malignant neoplasm, unspecified: Secondary | ICD-10-CM | POA: Diagnosis not present

## 2017-11-23 DIAGNOSIS — Z0000 Encounter for general adult medical examination without abnormal findings: Secondary | ICD-10-CM | POA: Diagnosis not present

## 2017-12-01 DIAGNOSIS — Z136 Encounter for screening for cardiovascular disorders: Secondary | ICD-10-CM | POA: Diagnosis not present

## 2017-12-01 DIAGNOSIS — M791 Myalgia, unspecified site: Secondary | ICD-10-CM | POA: Diagnosis not present

## 2017-12-01 DIAGNOSIS — Z0000 Encounter for general adult medical examination without abnormal findings: Secondary | ICD-10-CM | POA: Diagnosis not present

## 2017-12-01 DIAGNOSIS — E559 Vitamin D deficiency, unspecified: Secondary | ICD-10-CM | POA: Diagnosis not present

## 2017-12-02 DIAGNOSIS — H01003 Unspecified blepharitis right eye, unspecified eyelid: Secondary | ICD-10-CM | POA: Diagnosis not present

## 2017-12-02 DIAGNOSIS — H40013 Open angle with borderline findings, low risk, bilateral: Secondary | ICD-10-CM | POA: Diagnosis not present

## 2017-12-02 DIAGNOSIS — H04123 Dry eye syndrome of bilateral lacrimal glands: Secondary | ICD-10-CM | POA: Diagnosis not present

## 2017-12-09 DIAGNOSIS — L570 Actinic keratosis: Secondary | ICD-10-CM | POA: Diagnosis not present

## 2017-12-09 DIAGNOSIS — Z808 Family history of malignant neoplasm of other organs or systems: Secondary | ICD-10-CM | POA: Diagnosis not present

## 2017-12-09 DIAGNOSIS — Z23 Encounter for immunization: Secondary | ICD-10-CM | POA: Diagnosis not present

## 2017-12-09 DIAGNOSIS — D2230 Melanocytic nevi of unspecified part of face: Secondary | ICD-10-CM | POA: Diagnosis not present

## 2017-12-09 DIAGNOSIS — D2262 Melanocytic nevi of left upper limb, including shoulder: Secondary | ICD-10-CM | POA: Diagnosis not present

## 2017-12-09 DIAGNOSIS — D2271 Melanocytic nevi of right lower limb, including hip: Secondary | ICD-10-CM | POA: Diagnosis not present

## 2017-12-23 DIAGNOSIS — Z6828 Body mass index (BMI) 28.0-28.9, adult: Secondary | ICD-10-CM | POA: Diagnosis not present

## 2017-12-23 DIAGNOSIS — K219 Gastro-esophageal reflux disease without esophagitis: Secondary | ICD-10-CM | POA: Diagnosis not present

## 2017-12-23 DIAGNOSIS — R030 Elevated blood-pressure reading, without diagnosis of hypertension: Secondary | ICD-10-CM | POA: Diagnosis not present

## 2017-12-23 DIAGNOSIS — M8580 Other specified disorders of bone density and structure, unspecified site: Secondary | ICD-10-CM | POA: Diagnosis not present

## 2017-12-23 DIAGNOSIS — Z0000 Encounter for general adult medical examination without abnormal findings: Secondary | ICD-10-CM | POA: Diagnosis not present

## 2017-12-23 DIAGNOSIS — M25559 Pain in unspecified hip: Secondary | ICD-10-CM | POA: Diagnosis not present

## 2018-01-04 DIAGNOSIS — R1013 Epigastric pain: Secondary | ICD-10-CM | POA: Diagnosis not present

## 2018-01-04 DIAGNOSIS — K219 Gastro-esophageal reflux disease without esophagitis: Secondary | ICD-10-CM | POA: Diagnosis not present

## 2018-01-10 DIAGNOSIS — K824 Cholesterolosis of gallbladder: Secondary | ICD-10-CM | POA: Diagnosis not present

## 2018-02-07 DIAGNOSIS — K2930 Chronic superficial gastritis without bleeding: Secondary | ICD-10-CM | POA: Diagnosis not present

## 2018-02-07 DIAGNOSIS — K2970 Gastritis, unspecified, without bleeding: Secondary | ICD-10-CM | POA: Diagnosis not present

## 2018-02-07 DIAGNOSIS — K210 Gastro-esophageal reflux disease with esophagitis: Secondary | ICD-10-CM | POA: Diagnosis not present

## 2018-02-07 DIAGNOSIS — R1013 Epigastric pain: Secondary | ICD-10-CM | POA: Diagnosis not present

## 2018-02-07 DIAGNOSIS — K219 Gastro-esophageal reflux disease without esophagitis: Secondary | ICD-10-CM | POA: Diagnosis not present

## 2018-03-18 DIAGNOSIS — K219 Gastro-esophageal reflux disease without esophagitis: Secondary | ICD-10-CM | POA: Diagnosis not present

## 2018-03-18 DIAGNOSIS — E785 Hyperlipidemia, unspecified: Secondary | ICD-10-CM | POA: Diagnosis not present

## 2018-03-18 DIAGNOSIS — R030 Elevated blood-pressure reading, without diagnosis of hypertension: Secondary | ICD-10-CM | POA: Diagnosis not present

## 2018-03-18 DIAGNOSIS — Z0000 Encounter for general adult medical examination without abnormal findings: Secondary | ICD-10-CM | POA: Diagnosis not present

## 2018-03-18 DIAGNOSIS — Z6828 Body mass index (BMI) 28.0-28.9, adult: Secondary | ICD-10-CM | POA: Diagnosis not present

## 2018-03-18 DIAGNOSIS — J111 Influenza due to unidentified influenza virus with other respiratory manifestations: Secondary | ICD-10-CM | POA: Diagnosis not present

## 2018-03-18 DIAGNOSIS — R05 Cough: Secondary | ICD-10-CM | POA: Diagnosis not present

## 2018-03-18 DIAGNOSIS — D518 Other vitamin B12 deficiency anemias: Secondary | ICD-10-CM | POA: Diagnosis not present

## 2018-03-18 DIAGNOSIS — M8580 Other specified disorders of bone density and structure, unspecified site: Secondary | ICD-10-CM | POA: Diagnosis not present

## 2018-03-18 DIAGNOSIS — M25559 Pain in unspecified hip: Secondary | ICD-10-CM | POA: Diagnosis not present

## 2018-03-18 DIAGNOSIS — J069 Acute upper respiratory infection, unspecified: Secondary | ICD-10-CM | POA: Diagnosis not present

## 2018-03-21 DIAGNOSIS — Z6828 Body mass index (BMI) 28.0-28.9, adult: Secondary | ICD-10-CM | POA: Diagnosis not present

## 2018-03-21 DIAGNOSIS — Z0000 Encounter for general adult medical examination without abnormal findings: Secondary | ICD-10-CM | POA: Diagnosis not present

## 2018-03-21 DIAGNOSIS — J111 Influenza due to unidentified influenza virus with other respiratory manifestations: Secondary | ICD-10-CM | POA: Diagnosis not present

## 2018-03-21 DIAGNOSIS — D518 Other vitamin B12 deficiency anemias: Secondary | ICD-10-CM | POA: Diagnosis not present

## 2018-03-21 DIAGNOSIS — M8580 Other specified disorders of bone density and structure, unspecified site: Secondary | ICD-10-CM | POA: Diagnosis not present

## 2018-03-21 DIAGNOSIS — E785 Hyperlipidemia, unspecified: Secondary | ICD-10-CM | POA: Diagnosis not present

## 2018-03-21 DIAGNOSIS — R030 Elevated blood-pressure reading, without diagnosis of hypertension: Secondary | ICD-10-CM | POA: Diagnosis not present

## 2018-03-21 DIAGNOSIS — J019 Acute sinusitis, unspecified: Secondary | ICD-10-CM | POA: Diagnosis not present

## 2018-07-20 DIAGNOSIS — H25011 Cortical age-related cataract, right eye: Secondary | ICD-10-CM | POA: Diagnosis not present

## 2018-07-20 DIAGNOSIS — H35033 Hypertensive retinopathy, bilateral: Secondary | ICD-10-CM | POA: Diagnosis not present

## 2018-07-20 DIAGNOSIS — H40013 Open angle with borderline findings, low risk, bilateral: Secondary | ICD-10-CM | POA: Diagnosis not present

## 2018-07-20 DIAGNOSIS — H2511 Age-related nuclear cataract, right eye: Secondary | ICD-10-CM | POA: Diagnosis not present

## 2018-07-26 DIAGNOSIS — Z0000 Encounter for general adult medical examination without abnormal findings: Secondary | ICD-10-CM | POA: Diagnosis not present

## 2018-07-26 DIAGNOSIS — M859 Disorder of bone density and structure, unspecified: Secondary | ICD-10-CM | POA: Diagnosis not present

## 2018-07-26 DIAGNOSIS — M8588 Other specified disorders of bone density and structure, other site: Secondary | ICD-10-CM | POA: Diagnosis not present

## 2018-07-26 DIAGNOSIS — Z79899 Other long term (current) drug therapy: Secondary | ICD-10-CM | POA: Diagnosis not present

## 2018-07-26 DIAGNOSIS — Z136 Encounter for screening for cardiovascular disorders: Secondary | ICD-10-CM | POA: Diagnosis not present

## 2018-08-03 DIAGNOSIS — Z6828 Body mass index (BMI) 28.0-28.9, adult: Secondary | ICD-10-CM | POA: Diagnosis not present

## 2018-08-03 DIAGNOSIS — M8580 Other specified disorders of bone density and structure, unspecified site: Secondary | ICD-10-CM | POA: Diagnosis not present

## 2018-08-03 DIAGNOSIS — Z0000 Encounter for general adult medical examination without abnormal findings: Secondary | ICD-10-CM | POA: Diagnosis not present

## 2018-08-03 DIAGNOSIS — M1611 Unilateral primary osteoarthritis, right hip: Secondary | ICD-10-CM | POA: Diagnosis not present

## 2018-08-03 DIAGNOSIS — Z23 Encounter for immunization: Secondary | ICD-10-CM | POA: Diagnosis not present

## 2018-08-03 DIAGNOSIS — J4520 Mild intermittent asthma, uncomplicated: Secondary | ICD-10-CM | POA: Diagnosis not present

## 2018-08-03 DIAGNOSIS — G4733 Obstructive sleep apnea (adult) (pediatric): Secondary | ICD-10-CM | POA: Diagnosis not present

## 2018-08-03 DIAGNOSIS — N951 Menopausal and female climacteric states: Secondary | ICD-10-CM | POA: Diagnosis not present

## 2018-08-23 DIAGNOSIS — R309 Painful micturition, unspecified: Secondary | ICD-10-CM | POA: Diagnosis not present

## 2018-08-23 DIAGNOSIS — N952 Postmenopausal atrophic vaginitis: Secondary | ICD-10-CM | POA: Diagnosis not present

## 2018-08-23 DIAGNOSIS — N390 Urinary tract infection, site not specified: Secondary | ICD-10-CM | POA: Diagnosis not present

## 2018-10-20 DIAGNOSIS — Z1231 Encounter for screening mammogram for malignant neoplasm of breast: Secondary | ICD-10-CM | POA: Diagnosis not present

## 2018-10-20 DIAGNOSIS — Z6828 Body mass index (BMI) 28.0-28.9, adult: Secondary | ICD-10-CM | POA: Diagnosis not present

## 2018-10-20 DIAGNOSIS — Z01419 Encounter for gynecological examination (general) (routine) without abnormal findings: Secondary | ICD-10-CM | POA: Diagnosis not present

## 2018-12-07 DIAGNOSIS — D2261 Melanocytic nevi of right upper limb, including shoulder: Secondary | ICD-10-CM | POA: Diagnosis not present

## 2018-12-07 DIAGNOSIS — L821 Other seborrheic keratosis: Secondary | ICD-10-CM | POA: Diagnosis not present

## 2018-12-07 DIAGNOSIS — D2230 Melanocytic nevi of unspecified part of face: Secondary | ICD-10-CM | POA: Diagnosis not present

## 2018-12-07 DIAGNOSIS — L719 Rosacea, unspecified: Secondary | ICD-10-CM | POA: Diagnosis not present

## 2018-12-07 DIAGNOSIS — Z808 Family history of malignant neoplasm of other organs or systems: Secondary | ICD-10-CM | POA: Diagnosis not present

## 2018-12-07 DIAGNOSIS — D485 Neoplasm of uncertain behavior of skin: Secondary | ICD-10-CM | POA: Diagnosis not present

## 2018-12-07 DIAGNOSIS — D2271 Melanocytic nevi of right lower limb, including hip: Secondary | ICD-10-CM | POA: Diagnosis not present

## 2018-12-07 DIAGNOSIS — D2262 Melanocytic nevi of left upper limb, including shoulder: Secondary | ICD-10-CM | POA: Diagnosis not present

## 2018-12-07 DIAGNOSIS — L708 Other acne: Secondary | ICD-10-CM | POA: Diagnosis not present

## 2018-12-07 DIAGNOSIS — L820 Inflamed seborrheic keratosis: Secondary | ICD-10-CM | POA: Diagnosis not present

## 2018-12-07 DIAGNOSIS — C44729 Squamous cell carcinoma of skin of left lower limb, including hip: Secondary | ICD-10-CM | POA: Diagnosis not present

## 2018-12-07 DIAGNOSIS — Z23 Encounter for immunization: Secondary | ICD-10-CM | POA: Diagnosis not present

## 2018-12-07 DIAGNOSIS — L570 Actinic keratosis: Secondary | ICD-10-CM | POA: Diagnosis not present

## 2018-12-14 DIAGNOSIS — F4322 Adjustment disorder with anxiety: Secondary | ICD-10-CM | POA: Diagnosis not present

## 2018-12-20 DIAGNOSIS — M1612 Unilateral primary osteoarthritis, left hip: Secondary | ICD-10-CM | POA: Diagnosis not present

## 2018-12-20 DIAGNOSIS — M1611 Unilateral primary osteoarthritis, right hip: Secondary | ICD-10-CM | POA: Diagnosis not present

## 2018-12-22 DIAGNOSIS — C44729 Squamous cell carcinoma of skin of left lower limb, including hip: Secondary | ICD-10-CM | POA: Diagnosis not present

## 2019-03-02 DIAGNOSIS — R030 Elevated blood-pressure reading, without diagnosis of hypertension: Secondary | ICD-10-CM | POA: Diagnosis not present

## 2019-03-02 DIAGNOSIS — Z0000 Encounter for general adult medical examination without abnormal findings: Secondary | ICD-10-CM | POA: Diagnosis not present

## 2019-03-02 DIAGNOSIS — E663 Overweight: Secondary | ICD-10-CM | POA: Diagnosis not present

## 2019-03-02 DIAGNOSIS — Z1389 Encounter for screening for other disorder: Secondary | ICD-10-CM | POA: Diagnosis not present

## 2019-03-02 DIAGNOSIS — Z7689 Persons encountering health services in other specified circumstances: Secondary | ICD-10-CM | POA: Diagnosis not present

## 2019-03-02 DIAGNOSIS — Z1159 Encounter for screening for other viral diseases: Secondary | ICD-10-CM | POA: Diagnosis not present

## 2019-03-02 DIAGNOSIS — Z6828 Body mass index (BMI) 28.0-28.9, adult: Secondary | ICD-10-CM | POA: Diagnosis not present

## 2019-03-02 DIAGNOSIS — R309 Painful micturition, unspecified: Secondary | ICD-10-CM | POA: Diagnosis not present

## 2019-03-02 DIAGNOSIS — K219 Gastro-esophageal reflux disease without esophagitis: Secondary | ICD-10-CM | POA: Diagnosis not present

## 2019-03-02 DIAGNOSIS — E785 Hyperlipidemia, unspecified: Secondary | ICD-10-CM | POA: Diagnosis not present

## 2019-03-02 DIAGNOSIS — N390 Urinary tract infection, site not specified: Secondary | ICD-10-CM | POA: Diagnosis not present

## 2019-03-02 DIAGNOSIS — E669 Obesity, unspecified: Secondary | ICD-10-CM | POA: Diagnosis not present

## 2019-03-21 DIAGNOSIS — N904 Leukoplakia of vulva: Secondary | ICD-10-CM | POA: Diagnosis not present

## 2019-03-21 DIAGNOSIS — L293 Anogenital pruritus, unspecified: Secondary | ICD-10-CM | POA: Diagnosis not present

## 2019-03-21 DIAGNOSIS — N952 Postmenopausal atrophic vaginitis: Secondary | ICD-10-CM | POA: Diagnosis not present

## 2019-03-21 DIAGNOSIS — N959 Unspecified menopausal and perimenopausal disorder: Secondary | ICD-10-CM | POA: Diagnosis not present

## 2019-03-21 DIAGNOSIS — Z113 Encounter for screening for infections with a predominantly sexual mode of transmission: Secondary | ICD-10-CM | POA: Diagnosis not present

## 2019-04-20 DIAGNOSIS — D485 Neoplasm of uncertain behavior of skin: Secondary | ICD-10-CM | POA: Diagnosis not present

## 2019-04-28 DIAGNOSIS — D485 Neoplasm of uncertain behavior of skin: Secondary | ICD-10-CM | POA: Diagnosis not present

## 2019-04-28 DIAGNOSIS — L570 Actinic keratosis: Secondary | ICD-10-CM | POA: Diagnosis not present

## 2019-04-28 DIAGNOSIS — Z23 Encounter for immunization: Secondary | ICD-10-CM | POA: Diagnosis not present

## 2019-05-22 DIAGNOSIS — M545 Low back pain: Secondary | ICD-10-CM | POA: Diagnosis not present

## 2019-05-22 DIAGNOSIS — M25552 Pain in left hip: Secondary | ICD-10-CM | POA: Diagnosis not present

## 2019-05-22 DIAGNOSIS — M25551 Pain in right hip: Secondary | ICD-10-CM | POA: Diagnosis not present

## 2019-06-13 DIAGNOSIS — M1611 Unilateral primary osteoarthritis, right hip: Secondary | ICD-10-CM | POA: Diagnosis not present

## 2019-06-13 DIAGNOSIS — J4520 Mild intermittent asthma, uncomplicated: Secondary | ICD-10-CM | POA: Diagnosis not present

## 2019-06-15 DIAGNOSIS — D2261 Melanocytic nevi of right upper limb, including shoulder: Secondary | ICD-10-CM | POA: Diagnosis not present

## 2019-06-15 DIAGNOSIS — Z808 Family history of malignant neoplasm of other organs or systems: Secondary | ICD-10-CM | POA: Diagnosis not present

## 2019-06-15 DIAGNOSIS — M25552 Pain in left hip: Secondary | ICD-10-CM | POA: Diagnosis not present

## 2019-06-15 DIAGNOSIS — M545 Low back pain: Secondary | ICD-10-CM | POA: Diagnosis not present

## 2019-06-15 DIAGNOSIS — L578 Other skin changes due to chronic exposure to nonionizing radiation: Secondary | ICD-10-CM | POA: Diagnosis not present

## 2019-06-15 DIAGNOSIS — D2262 Melanocytic nevi of left upper limb, including shoulder: Secondary | ICD-10-CM | POA: Diagnosis not present

## 2019-06-15 DIAGNOSIS — Z85828 Personal history of other malignant neoplasm of skin: Secondary | ICD-10-CM | POA: Diagnosis not present

## 2019-06-15 DIAGNOSIS — L821 Other seborrheic keratosis: Secondary | ICD-10-CM | POA: Diagnosis not present

## 2019-06-15 DIAGNOSIS — M25551 Pain in right hip: Secondary | ICD-10-CM | POA: Diagnosis not present

## 2019-06-15 DIAGNOSIS — D2271 Melanocytic nevi of right lower limb, including hip: Secondary | ICD-10-CM | POA: Diagnosis not present

## 2019-06-15 DIAGNOSIS — D2230 Melanocytic nevi of unspecified part of face: Secondary | ICD-10-CM | POA: Diagnosis not present

## 2019-06-16 DIAGNOSIS — M5416 Radiculopathy, lumbar region: Secondary | ICD-10-CM | POA: Diagnosis not present

## 2019-06-16 DIAGNOSIS — M545 Low back pain: Secondary | ICD-10-CM | POA: Diagnosis not present

## 2019-06-22 ENCOUNTER — Other Ambulatory Visit: Payer: Self-pay | Admitting: Orthopedic Surgery

## 2019-06-22 DIAGNOSIS — M48061 Spinal stenosis, lumbar region without neurogenic claudication: Secondary | ICD-10-CM

## 2019-06-24 ENCOUNTER — Ambulatory Visit
Admission: RE | Admit: 2019-06-24 | Discharge: 2019-06-24 | Disposition: A | Discharge status: Home / Self Care | Payer: BLUE CROSS/BLUE SHIELD | Source: Ambulatory Visit | Attending: Orthopedic Surgery | Admitting: Orthopedic Surgery

## 2019-06-24 DIAGNOSIS — M48061 Spinal stenosis, lumbar region without neurogenic claudication: Secondary | ICD-10-CM | POA: Diagnosis not present

## 2019-07-04 DIAGNOSIS — M5416 Radiculopathy, lumbar region: Secondary | ICD-10-CM | POA: Diagnosis not present

## 2019-07-12 DIAGNOSIS — M545 Low back pain: Secondary | ICD-10-CM | POA: Diagnosis not present

## 2019-07-12 DIAGNOSIS — M25552 Pain in left hip: Secondary | ICD-10-CM | POA: Diagnosis not present

## 2019-07-24 DIAGNOSIS — H2511 Age-related nuclear cataract, right eye: Secondary | ICD-10-CM | POA: Diagnosis not present

## 2019-07-24 DIAGNOSIS — H35033 Hypertensive retinopathy, bilateral: Secondary | ICD-10-CM | POA: Diagnosis not present

## 2019-07-24 DIAGNOSIS — H524 Presbyopia: Secondary | ICD-10-CM | POA: Diagnosis not present

## 2019-07-24 DIAGNOSIS — H40013 Open angle with borderline findings, low risk, bilateral: Secondary | ICD-10-CM | POA: Diagnosis not present

## 2019-07-24 DIAGNOSIS — H25011 Cortical age-related cataract, right eye: Secondary | ICD-10-CM | POA: Diagnosis not present

## 2019-08-02 DIAGNOSIS — M25552 Pain in left hip: Secondary | ICD-10-CM | POA: Diagnosis not present

## 2019-08-02 DIAGNOSIS — M545 Low back pain: Secondary | ICD-10-CM | POA: Diagnosis not present

## 2019-08-14 DIAGNOSIS — J4520 Mild intermittent asthma, uncomplicated: Secondary | ICD-10-CM | POA: Diagnosis not present

## 2019-08-14 DIAGNOSIS — Z0000 Encounter for general adult medical examination without abnormal findings: Secondary | ICD-10-CM | POA: Diagnosis not present

## 2019-08-14 DIAGNOSIS — Z23 Encounter for immunization: Secondary | ICD-10-CM | POA: Diagnosis not present

## 2019-08-14 DIAGNOSIS — G4733 Obstructive sleep apnea (adult) (pediatric): Secondary | ICD-10-CM | POA: Diagnosis not present

## 2019-08-14 DIAGNOSIS — N951 Menopausal and female climacteric states: Secondary | ICD-10-CM | POA: Diagnosis not present

## 2019-08-14 DIAGNOSIS — F5089 Other specified eating disorder: Secondary | ICD-10-CM | POA: Diagnosis not present

## 2019-08-14 DIAGNOSIS — M1611 Unilateral primary osteoarthritis, right hip: Secondary | ICD-10-CM | POA: Diagnosis not present

## 2019-08-14 DIAGNOSIS — Z6828 Body mass index (BMI) 28.0-28.9, adult: Secondary | ICD-10-CM | POA: Diagnosis not present

## 2019-08-14 DIAGNOSIS — E663 Overweight: Secondary | ICD-10-CM | POA: Diagnosis not present

## 2019-08-16 DIAGNOSIS — L821 Other seborrheic keratosis: Secondary | ICD-10-CM | POA: Diagnosis not present

## 2019-08-16 DIAGNOSIS — D485 Neoplasm of uncertain behavior of skin: Secondary | ICD-10-CM | POA: Diagnosis not present

## 2019-08-16 DIAGNOSIS — L570 Actinic keratosis: Secondary | ICD-10-CM | POA: Diagnosis not present

## 2019-08-17 DIAGNOSIS — N760 Acute vaginitis: Secondary | ICD-10-CM | POA: Diagnosis not present

## 2019-08-17 DIAGNOSIS — R300 Dysuria: Secondary | ICD-10-CM | POA: Diagnosis not present

## 2019-08-17 DIAGNOSIS — N952 Postmenopausal atrophic vaginitis: Secondary | ICD-10-CM | POA: Diagnosis not present

## 2019-08-17 DIAGNOSIS — R309 Painful micturition, unspecified: Secondary | ICD-10-CM | POA: Diagnosis not present

## 2019-08-23 DIAGNOSIS — M25552 Pain in left hip: Secondary | ICD-10-CM | POA: Diagnosis not present

## 2019-08-23 DIAGNOSIS — M545 Low back pain: Secondary | ICD-10-CM | POA: Diagnosis not present

## 2019-08-30 DIAGNOSIS — J4520 Mild intermittent asthma, uncomplicated: Secondary | ICD-10-CM | POA: Diagnosis not present

## 2019-08-30 DIAGNOSIS — M1611 Unilateral primary osteoarthritis, right hip: Secondary | ICD-10-CM | POA: Diagnosis not present

## 2019-09-06 DIAGNOSIS — M859 Disorder of bone density and structure, unspecified: Secondary | ICD-10-CM | POA: Diagnosis not present

## 2019-09-06 DIAGNOSIS — E663 Overweight: Secondary | ICD-10-CM | POA: Diagnosis not present

## 2019-09-06 DIAGNOSIS — Z79899 Other long term (current) drug therapy: Secondary | ICD-10-CM | POA: Diagnosis not present

## 2019-09-12 DIAGNOSIS — M1611 Unilateral primary osteoarthritis, right hip: Secondary | ICD-10-CM | POA: Diagnosis not present

## 2019-09-12 DIAGNOSIS — M1612 Unilateral primary osteoarthritis, left hip: Secondary | ICD-10-CM | POA: Diagnosis not present

## 2019-09-13 DIAGNOSIS — M25552 Pain in left hip: Secondary | ICD-10-CM | POA: Diagnosis not present

## 2019-09-13 DIAGNOSIS — M545 Low back pain: Secondary | ICD-10-CM | POA: Diagnosis not present

## 2019-09-14 DIAGNOSIS — G4733 Obstructive sleep apnea (adult) (pediatric): Secondary | ICD-10-CM | POA: Diagnosis not present

## 2019-09-20 DIAGNOSIS — Z01818 Encounter for other preprocedural examination: Secondary | ICD-10-CM | POA: Diagnosis not present

## 2019-09-20 DIAGNOSIS — Z136 Encounter for screening for cardiovascular disorders: Secondary | ICD-10-CM | POA: Diagnosis not present

## 2019-09-20 DIAGNOSIS — M1611 Unilateral primary osteoarthritis, right hip: Secondary | ICD-10-CM | POA: Diagnosis not present

## 2019-09-26 DIAGNOSIS — M1612 Unilateral primary osteoarthritis, left hip: Secondary | ICD-10-CM | POA: Diagnosis not present

## 2019-09-26 MED FILL — ONDANSETRON HCL 4 MG TABS: 4 | 15 days supply | Qty: 30 | Fill #0

## 2019-09-26 MED FILL — HYDROmorphone HCL 2 MG TABS: 2 | 5 days supply | Qty: 30 | Fill #0

## 2019-09-26 MED FILL — METHOCARBAMOL 500 MG TABS: 500 | 15 days supply | Qty: 60 | Fill #0

## 2019-09-26 MED FILL — GABAPENTIN 100 MG CAPSULE: 100 | 30 days supply | Qty: 30 | Fill #0

## 2019-09-26 MED FILL — CELECOXIB 200 MG CAP: 200 | 30 days supply | Qty: 60 | Fill #0

## 2019-10-05 MED FILL — HYDROmorphone HCL 2 MG TABS: 2 | 5 days supply | Qty: 30 | Fill #0

## 2019-10-05 MED FILL — ONDANSETRON HCL 4 MG TABS: 4 | 15 days supply | Qty: 30 | Fill #0

## 2019-10-05 MED FILL — METHOCARBAMOL 500 MG TABS: 500 | 15 days supply | Qty: 60 | Fill #0

## 2019-10-06 DIAGNOSIS — M1612 Unilateral primary osteoarthritis, left hip: Secondary | ICD-10-CM | POA: Diagnosis not present

## 2019-10-09 DIAGNOSIS — Z96642 Presence of left artificial hip joint: Secondary | ICD-10-CM | POA: Diagnosis not present

## 2019-10-11 DIAGNOSIS — Z96642 Presence of left artificial hip joint: Secondary | ICD-10-CM | POA: Diagnosis not present

## 2019-10-11 DIAGNOSIS — G4733 Obstructive sleep apnea (adult) (pediatric): Secondary | ICD-10-CM | POA: Diagnosis not present

## 2019-10-12 DIAGNOSIS — M1611 Unilateral primary osteoarthritis, right hip: Secondary | ICD-10-CM | POA: Diagnosis not present

## 2019-10-12 DIAGNOSIS — Z96642 Presence of left artificial hip joint: Secondary | ICD-10-CM | POA: Diagnosis not present

## 2019-10-12 DIAGNOSIS — Z9889 Other specified postprocedural states: Secondary | ICD-10-CM | POA: Diagnosis not present

## 2019-10-13 DIAGNOSIS — C44729 Squamous cell carcinoma of skin of left lower limb, including hip: Secondary | ICD-10-CM | POA: Diagnosis not present

## 2019-10-13 DIAGNOSIS — Z96642 Presence of left artificial hip joint: Secondary | ICD-10-CM | POA: Diagnosis not present

## 2019-10-13 DIAGNOSIS — D485 Neoplasm of uncertain behavior of skin: Secondary | ICD-10-CM | POA: Diagnosis not present

## 2019-10-13 DIAGNOSIS — L239 Allergic contact dermatitis, unspecified cause: Secondary | ICD-10-CM | POA: Diagnosis not present

## 2019-10-16 DIAGNOSIS — Z96642 Presence of left artificial hip joint: Secondary | ICD-10-CM | POA: Diagnosis not present

## 2019-10-18 DIAGNOSIS — Z96642 Presence of left artificial hip joint: Secondary | ICD-10-CM | POA: Diagnosis not present

## 2019-10-18 MED FILL — AMOXICILLIN 500 MG CAPSULE: 500 | 1 days supply | Qty: 4 | Fill #0

## 2019-10-19 DIAGNOSIS — C44729 Squamous cell carcinoma of skin of left lower limb, including hip: Secondary | ICD-10-CM | POA: Diagnosis not present

## 2019-10-24 DIAGNOSIS — Z96642 Presence of left artificial hip joint: Secondary | ICD-10-CM | POA: Diagnosis not present

## 2019-10-26 DIAGNOSIS — Z96642 Presence of left artificial hip joint: Secondary | ICD-10-CM | POA: Diagnosis not present

## 2019-10-31 DIAGNOSIS — Z9889 Other specified postprocedural states: Secondary | ICD-10-CM | POA: Diagnosis not present

## 2019-10-31 DIAGNOSIS — Z96642 Presence of left artificial hip joint: Secondary | ICD-10-CM | POA: Diagnosis not present

## 2019-11-02 DIAGNOSIS — M25552 Pain in left hip: Secondary | ICD-10-CM | POA: Diagnosis not present

## 2019-11-03 DIAGNOSIS — Z96642 Presence of left artificial hip joint: Secondary | ICD-10-CM | POA: Diagnosis not present

## 2019-11-06 DIAGNOSIS — H40013 Open angle with borderline findings, low risk, bilateral: Secondary | ICD-10-CM | POA: Diagnosis not present

## 2019-11-08 DIAGNOSIS — Z01818 Encounter for other preprocedural examination: Secondary | ICD-10-CM | POA: Diagnosis not present

## 2019-11-08 DIAGNOSIS — Z23 Encounter for immunization: Secondary | ICD-10-CM | POA: Diagnosis not present

## 2019-11-09 DIAGNOSIS — Z96642 Presence of left artificial hip joint: Secondary | ICD-10-CM | POA: Diagnosis not present

## 2019-11-11 DIAGNOSIS — M25552 Pain in left hip: Secondary | ICD-10-CM | POA: Diagnosis not present

## 2019-11-14 DIAGNOSIS — M1611 Unilateral primary osteoarthritis, right hip: Secondary | ICD-10-CM | POA: Diagnosis not present

## 2019-11-16 DIAGNOSIS — M8588 Other specified disorders of bone density and structure, other site: Secondary | ICD-10-CM | POA: Diagnosis not present

## 2019-11-16 DIAGNOSIS — Z01419 Encounter for gynecological examination (general) (routine) without abnormal findings: Secondary | ICD-10-CM | POA: Diagnosis not present

## 2019-11-16 DIAGNOSIS — Z1231 Encounter for screening mammogram for malignant neoplasm of breast: Secondary | ICD-10-CM | POA: Diagnosis not present

## 2019-11-16 DIAGNOSIS — N958 Other specified menopausal and perimenopausal disorders: Secondary | ICD-10-CM | POA: Diagnosis not present

## 2019-11-16 DIAGNOSIS — M25552 Pain in left hip: Secondary | ICD-10-CM | POA: Diagnosis not present

## 2019-11-16 DIAGNOSIS — Z6828 Body mass index (BMI) 28.0-28.9, adult: Secondary | ICD-10-CM | POA: Diagnosis not present

## 2019-11-17 DIAGNOSIS — Z96642 Presence of left artificial hip joint: Secondary | ICD-10-CM | POA: Diagnosis not present

## 2019-11-24 DIAGNOSIS — M1611 Unilateral primary osteoarthritis, right hip: Secondary | ICD-10-CM | POA: Diagnosis not present

## 2019-11-27 DIAGNOSIS — Z96641 Presence of right artificial hip joint: Secondary | ICD-10-CM | POA: Diagnosis not present

## 2019-11-29 DIAGNOSIS — Z96641 Presence of right artificial hip joint: Secondary | ICD-10-CM | POA: Diagnosis not present

## 2019-12-05 DIAGNOSIS — Z9889 Other specified postprocedural states: Secondary | ICD-10-CM | POA: Diagnosis not present

## 2019-12-05 DIAGNOSIS — Z96641 Presence of right artificial hip joint: Secondary | ICD-10-CM | POA: Diagnosis not present

## 2019-12-05 DIAGNOSIS — M1611 Unilateral primary osteoarthritis, right hip: Secondary | ICD-10-CM | POA: Diagnosis not present

## 2019-12-07 DIAGNOSIS — M1611 Unilateral primary osteoarthritis, right hip: Secondary | ICD-10-CM | POA: Diagnosis not present

## 2019-12-07 DIAGNOSIS — J4520 Mild intermittent asthma, uncomplicated: Secondary | ICD-10-CM | POA: Diagnosis not present

## 2019-12-08 DIAGNOSIS — Z96641 Presence of right artificial hip joint: Secondary | ICD-10-CM | POA: Diagnosis not present

## 2019-12-11 DIAGNOSIS — Z96641 Presence of right artificial hip joint: Secondary | ICD-10-CM | POA: Diagnosis not present

## 2019-12-13 DIAGNOSIS — Z96641 Presence of right artificial hip joint: Secondary | ICD-10-CM | POA: Diagnosis not present

## 2019-12-15 DIAGNOSIS — D2271 Melanocytic nevi of right lower limb, including hip: Secondary | ICD-10-CM | POA: Diagnosis not present

## 2019-12-15 DIAGNOSIS — Z808 Family history of malignant neoplasm of other organs or systems: Secondary | ICD-10-CM | POA: Diagnosis not present

## 2019-12-15 DIAGNOSIS — D2230 Melanocytic nevi of unspecified part of face: Secondary | ICD-10-CM | POA: Diagnosis not present

## 2019-12-15 DIAGNOSIS — L821 Other seborrheic keratosis: Secondary | ICD-10-CM | POA: Diagnosis not present

## 2019-12-15 DIAGNOSIS — L820 Inflamed seborrheic keratosis: Secondary | ICD-10-CM | POA: Diagnosis not present

## 2019-12-15 DIAGNOSIS — D2262 Melanocytic nevi of left upper limb, including shoulder: Secondary | ICD-10-CM | POA: Diagnosis not present

## 2019-12-15 DIAGNOSIS — Z85828 Personal history of other malignant neoplasm of skin: Secondary | ICD-10-CM | POA: Diagnosis not present

## 2019-12-15 DIAGNOSIS — D2261 Melanocytic nevi of right upper limb, including shoulder: Secondary | ICD-10-CM | POA: Diagnosis not present

## 2019-12-15 DIAGNOSIS — L578 Other skin changes due to chronic exposure to nonionizing radiation: Secondary | ICD-10-CM | POA: Diagnosis not present

## 2019-12-19 DIAGNOSIS — Z96641 Presence of right artificial hip joint: Secondary | ICD-10-CM | POA: Diagnosis not present

## 2019-12-19 DIAGNOSIS — G4733 Obstructive sleep apnea (adult) (pediatric): Secondary | ICD-10-CM | POA: Diagnosis not present

## 2019-12-22 DIAGNOSIS — Z96641 Presence of right artificial hip joint: Secondary | ICD-10-CM | POA: Diagnosis not present

## 2019-12-25 DIAGNOSIS — Z96641 Presence of right artificial hip joint: Secondary | ICD-10-CM | POA: Diagnosis not present

## 2019-12-26 DIAGNOSIS — G4733 Obstructive sleep apnea (adult) (pediatric): Secondary | ICD-10-CM | POA: Diagnosis not present

## 2019-12-28 DIAGNOSIS — Z96641 Presence of right artificial hip joint: Secondary | ICD-10-CM | POA: Diagnosis not present

## 2020-01-02 DIAGNOSIS — Z9889 Other specified postprocedural states: Secondary | ICD-10-CM | POA: Diagnosis not present

## 2020-01-02 DIAGNOSIS — Z96642 Presence of left artificial hip joint: Secondary | ICD-10-CM | POA: Diagnosis not present

## 2020-01-02 DIAGNOSIS — Z96641 Presence of right artificial hip joint: Secondary | ICD-10-CM | POA: Diagnosis not present

## 2020-02-26 DIAGNOSIS — L900 Lichen sclerosus et atrophicus: Secondary | ICD-10-CM | POA: Diagnosis not present

## 2020-03-08 DIAGNOSIS — M795 Residual foreign body in soft tissue: Secondary | ICD-10-CM | POA: Diagnosis not present

## 2020-03-08 DIAGNOSIS — L720 Epidermal cyst: Secondary | ICD-10-CM | POA: Diagnosis not present

## 2020-03-08 DIAGNOSIS — D485 Neoplasm of uncertain behavior of skin: Secondary | ICD-10-CM | POA: Diagnosis not present

## 2020-03-13 DIAGNOSIS — M1611 Unilateral primary osteoarthritis, right hip: Secondary | ICD-10-CM | POA: Diagnosis not present

## 2020-03-13 DIAGNOSIS — J4520 Mild intermittent asthma, uncomplicated: Secondary | ICD-10-CM | POA: Diagnosis not present

## 2020-03-13 DIAGNOSIS — M8580 Other specified disorders of bone density and structure, unspecified site: Secondary | ICD-10-CM | POA: Diagnosis not present

## 2020-03-27 DIAGNOSIS — R059 Cough, unspecified: Secondary | ICD-10-CM | POA: Diagnosis not present

## 2020-03-27 DIAGNOSIS — J4520 Mild intermittent asthma, uncomplicated: Secondary | ICD-10-CM | POA: Diagnosis not present

## 2020-04-04 DIAGNOSIS — L905 Scar conditions and fibrosis of skin: Secondary | ICD-10-CM | POA: Diagnosis not present

## 2020-04-04 DIAGNOSIS — D485 Neoplasm of uncertain behavior of skin: Secondary | ICD-10-CM | POA: Diagnosis not present

## 2020-05-22 DIAGNOSIS — Z6828 Body mass index (BMI) 28.0-28.9, adult: Secondary | ICD-10-CM | POA: Diagnosis not present

## 2020-05-22 DIAGNOSIS — R42 Dizziness and giddiness: Secondary | ICD-10-CM | POA: Diagnosis not present

## 2020-05-22 DIAGNOSIS — G4733 Obstructive sleep apnea (adult) (pediatric): Secondary | ICD-10-CM | POA: Diagnosis not present

## 2020-05-22 DIAGNOSIS — M8580 Other specified disorders of bone density and structure, unspecified site: Secondary | ICD-10-CM | POA: Diagnosis not present

## 2020-05-22 DIAGNOSIS — Z780 Asymptomatic menopausal state: Secondary | ICD-10-CM | POA: Diagnosis not present

## 2020-05-22 DIAGNOSIS — N952 Postmenopausal atrophic vaginitis: Secondary | ICD-10-CM | POA: Diagnosis not present

## 2020-06-05 DIAGNOSIS — G4733 Obstructive sleep apnea (adult) (pediatric): Secondary | ICD-10-CM | POA: Diagnosis not present

## 2020-06-05 DIAGNOSIS — M8580 Other specified disorders of bone density and structure, unspecified site: Secondary | ICD-10-CM | POA: Diagnosis not present

## 2020-06-05 DIAGNOSIS — R42 Dizziness and giddiness: Secondary | ICD-10-CM | POA: Diagnosis not present

## 2020-06-06 DIAGNOSIS — M1611 Unilateral primary osteoarthritis, right hip: Secondary | ICD-10-CM | POA: Diagnosis not present

## 2020-06-06 DIAGNOSIS — M8580 Other specified disorders of bone density and structure, unspecified site: Secondary | ICD-10-CM | POA: Diagnosis not present

## 2020-06-06 DIAGNOSIS — J4520 Mild intermittent asthma, uncomplicated: Secondary | ICD-10-CM | POA: Diagnosis not present

## 2020-06-21 DIAGNOSIS — G4733 Obstructive sleep apnea (adult) (pediatric): Secondary | ICD-10-CM | POA: Diagnosis not present

## 2020-06-21 DIAGNOSIS — M8580 Other specified disorders of bone density and structure, unspecified site: Secondary | ICD-10-CM | POA: Diagnosis not present

## 2020-06-21 DIAGNOSIS — R42 Dizziness and giddiness: Secondary | ICD-10-CM | POA: Diagnosis not present

## 2020-07-10 DIAGNOSIS — D2261 Melanocytic nevi of right upper limb, including shoulder: Secondary | ICD-10-CM | POA: Diagnosis not present

## 2020-07-10 DIAGNOSIS — D485 Neoplasm of uncertain behavior of skin: Secondary | ICD-10-CM | POA: Diagnosis not present

## 2020-07-10 DIAGNOSIS — L309 Dermatitis, unspecified: Secondary | ICD-10-CM | POA: Diagnosis not present

## 2020-07-10 DIAGNOSIS — D2262 Melanocytic nevi of left upper limb, including shoulder: Secondary | ICD-10-CM | POA: Diagnosis not present

## 2020-07-10 DIAGNOSIS — D2271 Melanocytic nevi of right lower limb, including hip: Secondary | ICD-10-CM | POA: Diagnosis not present

## 2020-07-10 DIAGNOSIS — D2230 Melanocytic nevi of unspecified part of face: Secondary | ICD-10-CM | POA: Diagnosis not present

## 2020-07-10 DIAGNOSIS — Z85828 Personal history of other malignant neoplasm of skin: Secondary | ICD-10-CM | POA: Diagnosis not present

## 2020-07-10 DIAGNOSIS — L578 Other skin changes due to chronic exposure to nonionizing radiation: Secondary | ICD-10-CM | POA: Diagnosis not present

## 2020-07-10 DIAGNOSIS — Z808 Family history of malignant neoplasm of other organs or systems: Secondary | ICD-10-CM | POA: Diagnosis not present

## 2020-07-10 DIAGNOSIS — L570 Actinic keratosis: Secondary | ICD-10-CM | POA: Diagnosis not present

## 2020-07-10 DIAGNOSIS — L821 Other seborrheic keratosis: Secondary | ICD-10-CM | POA: Diagnosis not present

## 2020-07-10 DIAGNOSIS — L820 Inflamed seborrheic keratosis: Secondary | ICD-10-CM | POA: Diagnosis not present

## 2020-07-31 DIAGNOSIS — H524 Presbyopia: Secondary | ICD-10-CM | POA: Diagnosis not present

## 2020-07-31 DIAGNOSIS — H35033 Hypertensive retinopathy, bilateral: Secondary | ICD-10-CM | POA: Diagnosis not present

## 2020-07-31 DIAGNOSIS — H2511 Age-related nuclear cataract, right eye: Secondary | ICD-10-CM | POA: Diagnosis not present

## 2020-07-31 DIAGNOSIS — H40013 Open angle with borderline findings, low risk, bilateral: Secondary | ICD-10-CM | POA: Diagnosis not present

## 2020-07-31 DIAGNOSIS — H35372 Puckering of macula, left eye: Secondary | ICD-10-CM | POA: Diagnosis not present

## 2020-07-31 DIAGNOSIS — H25011 Cortical age-related cataract, right eye: Secondary | ICD-10-CM | POA: Diagnosis not present

## 2020-07-31 DIAGNOSIS — H31003 Unspecified chorioretinal scars, bilateral: Secondary | ICD-10-CM | POA: Diagnosis not present

## 2020-07-31 DIAGNOSIS — H35362 Drusen (degenerative) of macula, left eye: Secondary | ICD-10-CM | POA: Diagnosis not present

## 2020-08-30 DIAGNOSIS — M1611 Unilateral primary osteoarthritis, right hip: Secondary | ICD-10-CM | POA: Diagnosis not present

## 2020-08-30 DIAGNOSIS — R2689 Other abnormalities of gait and mobility: Secondary | ICD-10-CM | POA: Diagnosis not present

## 2020-08-30 DIAGNOSIS — Z0000 Encounter for general adult medical examination without abnormal findings: Secondary | ICD-10-CM | POA: Diagnosis not present

## 2020-08-30 DIAGNOSIS — N951 Menopausal and female climacteric states: Secondary | ICD-10-CM | POA: Diagnosis not present

## 2020-08-30 DIAGNOSIS — Z6828 Body mass index (BMI) 28.0-28.9, adult: Secondary | ICD-10-CM | POA: Diagnosis not present

## 2020-08-30 DIAGNOSIS — Z1389 Encounter for screening for other disorder: Secondary | ICD-10-CM | POA: Diagnosis not present

## 2020-08-30 DIAGNOSIS — J4520 Mild intermittent asthma, uncomplicated: Secondary | ICD-10-CM | POA: Diagnosis not present

## 2020-09-04 DIAGNOSIS — Z6828 Body mass index (BMI) 28.0-28.9, adult: Secondary | ICD-10-CM | POA: Diagnosis not present

## 2020-09-04 DIAGNOSIS — M1611 Unilateral primary osteoarthritis, right hip: Secondary | ICD-10-CM | POA: Diagnosis not present

## 2020-09-04 DIAGNOSIS — R2689 Other abnormalities of gait and mobility: Secondary | ICD-10-CM | POA: Diagnosis not present

## 2020-09-04 DIAGNOSIS — E7800 Pure hypercholesterolemia, unspecified: Secondary | ICD-10-CM | POA: Diagnosis not present

## 2020-09-04 DIAGNOSIS — Z79899 Other long term (current) drug therapy: Secondary | ICD-10-CM | POA: Diagnosis not present

## 2020-09-04 DIAGNOSIS — M859 Disorder of bone density and structure, unspecified: Secondary | ICD-10-CM | POA: Diagnosis not present

## 2020-09-04 DIAGNOSIS — J4520 Mild intermittent asthma, uncomplicated: Secondary | ICD-10-CM | POA: Diagnosis not present

## 2020-09-04 DIAGNOSIS — M8588 Other specified disorders of bone density and structure, other site: Secondary | ICD-10-CM | POA: Diagnosis not present

## 2020-09-04 DIAGNOSIS — N951 Menopausal and female climacteric states: Secondary | ICD-10-CM | POA: Diagnosis not present

## 2020-09-05 DIAGNOSIS — J4520 Mild intermittent asthma, uncomplicated: Secondary | ICD-10-CM | POA: Diagnosis not present

## 2020-09-05 DIAGNOSIS — E7800 Pure hypercholesterolemia, unspecified: Secondary | ICD-10-CM | POA: Diagnosis not present

## 2020-09-05 DIAGNOSIS — H35033 Hypertensive retinopathy, bilateral: Secondary | ICD-10-CM | POA: Diagnosis not present

## 2020-09-05 DIAGNOSIS — M1611 Unilateral primary osteoarthritis, right hip: Secondary | ICD-10-CM | POA: Diagnosis not present

## 2020-09-05 DIAGNOSIS — M8580 Other specified disorders of bone density and structure, unspecified site: Secondary | ICD-10-CM | POA: Diagnosis not present

## 2020-09-11 DIAGNOSIS — G4733 Obstructive sleep apnea (adult) (pediatric): Secondary | ICD-10-CM | POA: Diagnosis not present

## 2020-09-17 DIAGNOSIS — H25011 Cortical age-related cataract, right eye: Secondary | ICD-10-CM | POA: Diagnosis not present

## 2020-09-17 DIAGNOSIS — H25811 Combined forms of age-related cataract, right eye: Secondary | ICD-10-CM | POA: Diagnosis not present

## 2020-09-17 DIAGNOSIS — H52211 Irregular astigmatism, right eye: Secondary | ICD-10-CM | POA: Diagnosis not present

## 2020-09-17 DIAGNOSIS — H2511 Age-related nuclear cataract, right eye: Secondary | ICD-10-CM | POA: Diagnosis not present

## 2020-09-24 DIAGNOSIS — G4733 Obstructive sleep apnea (adult) (pediatric): Secondary | ICD-10-CM | POA: Diagnosis not present

## 2020-10-01 DIAGNOSIS — Z96641 Presence of right artificial hip joint: Secondary | ICD-10-CM | POA: Diagnosis not present

## 2020-10-01 DIAGNOSIS — M25551 Pain in right hip: Secondary | ICD-10-CM | POA: Diagnosis not present

## 2020-10-01 DIAGNOSIS — Z96642 Presence of left artificial hip joint: Secondary | ICD-10-CM | POA: Diagnosis not present

## 2020-10-01 DIAGNOSIS — M25552 Pain in left hip: Secondary | ICD-10-CM | POA: Diagnosis not present

## 2020-10-22 DIAGNOSIS — N952 Postmenopausal atrophic vaginitis: Secondary | ICD-10-CM | POA: Diagnosis not present

## 2020-10-22 DIAGNOSIS — R309 Painful micturition, unspecified: Secondary | ICD-10-CM | POA: Diagnosis not present

## 2020-10-22 DIAGNOSIS — R102 Pelvic and perineal pain: Secondary | ICD-10-CM | POA: Diagnosis not present

## 2020-10-29 DIAGNOSIS — R079 Chest pain, unspecified: Secondary | ICD-10-CM | POA: Diagnosis not present

## 2020-10-30 ENCOUNTER — Emergency Department (HOSPITAL_COMMUNITY): Payer: PPO

## 2020-10-30 ENCOUNTER — Encounter (HOSPITAL_COMMUNITY): Payer: Self-pay | Admitting: Emergency Medicine

## 2020-10-30 ENCOUNTER — Emergency Department (HOSPITAL_COMMUNITY)
Admission: EM | Admit: 2020-10-30 | Discharge: 2020-10-31 | Disposition: A | Discharge status: Home / Self Care | Payer: PPO | Attending: Emergency Medicine | Admitting: Emergency Medicine

## 2020-10-30 DIAGNOSIS — Z7982 Long term (current) use of aspirin: Secondary | ICD-10-CM | POA: Insufficient documentation

## 2020-10-30 DIAGNOSIS — J45909 Unspecified asthma, uncomplicated: Secondary | ICD-10-CM | POA: Diagnosis not present

## 2020-10-30 DIAGNOSIS — M542 Cervicalgia: Secondary | ICD-10-CM | POA: Diagnosis not present

## 2020-10-30 DIAGNOSIS — Z79899 Other long term (current) drug therapy: Secondary | ICD-10-CM | POA: Insufficient documentation

## 2020-10-30 DIAGNOSIS — R072 Precordial pain: Secondary | ICD-10-CM | POA: Insufficient documentation

## 2020-10-30 DIAGNOSIS — R079 Chest pain, unspecified: Secondary | ICD-10-CM | POA: Diagnosis not present

## 2020-10-30 DIAGNOSIS — R0789 Other chest pain: Secondary | ICD-10-CM | POA: Diagnosis not present

## 2020-10-30 LAB — CBC WITH DIFFERENTIAL/PLATELET
Abs Immature Granulocytes: 0.01 10*3/uL (ref 0.00–0.07)
Basophils Absolute: 0 10*3/uL (ref 0.0–0.1)
Basophils Relative: 1 %
Eosinophils Absolute: 0.1 10*3/uL (ref 0.0–0.5)
Eosinophils Relative: 1 %
HCT: 36.3 % (ref 36.0–46.0)
Hemoglobin: 12.2 g/dL (ref 12.0–15.0)
Immature Granulocytes: 0 %
Lymphocytes Relative: 24 %
Lymphs Abs: 1.8 10*3/uL (ref 0.7–4.0)
MCH: 30 pg (ref 26.0–34.0)
MCHC: 33.6 g/dL (ref 30.0–36.0)
MCV: 89.4 fL (ref 80.0–100.0)
Monocytes Absolute: 0.4 10*3/uL (ref 0.1–1.0)
Monocytes Relative: 6 %
Neutro Abs: 5 10*3/uL (ref 1.7–7.7)
Neutrophils Relative %: 68 %
Platelets: 289 10*3/uL (ref 150–400)
RBC: 4.06 MIL/uL (ref 3.87–5.11)
RDW: 12.4 % (ref 11.5–15.5)
WBC: 7.4 10*3/uL (ref 4.0–10.5)
nRBC: 0 % (ref 0.0–0.2)

## 2020-10-30 LAB — COMPREHENSIVE METABOLIC PANEL
ALT: 20 U/L (ref 0–44)
AST: 20 U/L (ref 15–41)
Albumin: 3.7 g/dL (ref 3.5–5.0)
Alkaline Phosphatase: 53 U/L (ref 38–126)
Anion gap: 7 (ref 5–15)
BUN: 15 mg/dL (ref 8–23)
CO2: 25 mmol/L (ref 22–32)
Calcium: 9.3 mg/dL (ref 8.9–10.3)
Chloride: 106 mmol/L (ref 98–111)
Creatinine, Ser: 0.8 mg/dL (ref 0.44–1.00)
GFR, Estimated: >60 mL/min (ref >60)
Glucose, Bld: 158 mg/dL — ABNORMAL HIGH (ref 70–99)
Potassium: 3.8 mmol/L (ref 3.5–5.1)
Sodium: 138 mmol/L (ref 135–145)
Total Bilirubin: 0.6 mg/dL (ref 0.3–1.2)
Total Protein: 6.1 g/dL — ABNORMAL LOW (ref 6.5–8.1)

## 2020-10-30 LAB — TROPONIN I (HIGH SENSITIVITY)
Troponin I (High Sensitivity): 7 ng/L (ref <18)
Troponin I (High Sensitivity): 7 ng/L (ref <18)

## 2020-10-30 NOTE — ED Provider Notes (Signed)
Emergency Medicine Provider Triage Evaluation Note  Cheryl Castillo , a 65 y.o. female  was evaluated in triage.  Pt complains of chest pain.  Chest pain has been intermittent since Sunday.  Pain is located to left chest and radiates to left arm.  Patient describes pain as a pressure.  Patient denies any associated shortness of breath, nausea, vomiting, or diaphoresis.  Denies any change in pain with exertion  Review of Systems  Positive: Chest pain Negative: Nausea, vomiting, shortness of breath, palpitations, leg swelling or tenderness, syncope  Physical Exam  BP 108/68 (BP Location: Right Arm)   Pulse 77   Temp 97.7 F (36.5 C) (Oral)   Resp 18   SpO2 100%  Gen:   Awake, no distress   Resp:  Normal effort, lungs clear to auscultation bilaterally MSK:   Moves extremities without difficulty  Other:  +2 radial pulse bilaterally  Medical Decision Making  Medically screening exam initiated at 1:43 PM.  Appropriate orders placed.  NALANIE SLOMA was informed that the remainder of the evaluation will be completed by another provider, this initial triage assessment does not replace that evaluation, and the importance of remaining in the ED until their evaluation is complete.  The patient appears stable so that the remainder of the work up may be completed by another provider.      Loni Beckwith, PA-C 10/30/20 1344    Elnora Morrison, MD 10/31/20 1119

## 2020-10-30 NOTE — ED Triage Notes (Signed)
Patient here with complaint of intermittent chest pain described as pressure since Sunday this week. Denies shortness of breath, denies nausea, denies emesis. Reports shivering and feeling cold.

## 2020-10-31 NOTE — ED Notes (Signed)
Pt here with intermittent L U chest wall P that sometimes goes into arm and sometimes goes into L side of jaw. Pt said there's no correlation between when it happens. Sometimes it's with exercise and sometimes not. Bikes 40 mi three times a week. Pt has a cardiac appt Friday, but had a trip coming up and was worried that she was going to be in [place] and get worse. Pt denies nausea or shortness of breath with it. Pt was ambulatory to restroom and steady on feet prior to me going in there. GCS 15. Pt denies pain currently. Call light within reach. Denies further needs.

## 2020-10-31 NOTE — ED Notes (Signed)
Report given to Jen, RN

## 2020-10-31 NOTE — Discharge Instructions (Addendum)

## 2020-10-31 NOTE — ED Notes (Signed)
Patient verbalizes understanding of discharge instructions. Opportunity for questioning and answers were provided. Armband removed by staff, pt discharged from ED and ambulated to lobby to return home with spouse.   

## 2020-10-31 NOTE — ED Provider Notes (Signed)
[facility] EMERGENCY DEPARTMENT Provider Note   CSN: [id number] Arrival date & time: 10/30/20  1320     History Chief Complaint  Patient presents with   Chest Pain    Cheryl Castillo is a 65 y.o. female.  The history is provided by the patient.  Chest Pain Pain location:  L chest Pain quality: pressure   Pain radiates to:  Neck and L arm Pain severity:  Moderate Timing:  Intermittent Progression:  Waxing and waning Chronicity:  New Relieved by:  Nothing Worsened by:  Nothing Associated symptoms: no diaphoresis, no fever, no nausea, no shortness of breath, no syncope and no weakness   Risk factors: no coronary artery disease, no diabetes mellitus, no high cholesterol, no hypertension, not obese, no prior DVT/PE and no smoking   Risk factors comment:  Combipatch  HPI: A 65 year old patient presents for evaluation of chest pain. Initial onset of pain was more than 6 hours ago. The patient's chest pain is described as heaviness/pressure/tightness and is not worse with exertion. The patient's chest pain is middle- or left-sided, is not well-localized, is not sharp and does radiate to the arms/jaw/neck. The patient does not complain of nausea and denies diaphoresis. The patient has no history of stroke, has no history of peripheral artery disease, has not smoked in the past 90 days, denies any history of treated diabetes, has no relevant family history of coronary artery disease (first degree relative at less than age 19), is not hypertensive, has no history of hypercholesterolemia and does not have an elevated BMI (>=30).  Patient reports she has had episodes of chest pain for several months.  This episode started several days ago.  The episodes last about 15 minutes, but they are not exertional and they come at random.  No other associated symptoms She reports that time she has pain in her neck and feels like there is numbness into her left arm.  No focal  weakness. Patient is very active and rides her bike up to 40 miles per week and never has any chest pain or shortness of breath with activity Past Medical History:  Diagnosis Date   Allergy    Asthma    Cataract    Depression     Patient Active Problem List   Diagnosis Date Noted   OSA on CPAP 07/18/2015   Cataract 07/27/2013   Thyroid nodule  bx done 06/2013 07/05/2013   Osteopenia 05/08/2013   Menopause 05/08/2013   Allergic rhinitis with asthma without status asthmaticus 12/03/2011   Hx of bacterial pneumonia 12/03/2011   History of IBS 12/03/2011   GERD (gastroesophageal reflux disease) 12/03/2011   Family history of colonic polyps 12/03/2011   Severe obstructive sleep apnea 12/03/2011   History of mood disorder 12/03/2011    Past Surgical History:  Procedure Laterality Date   APPENDECTOMY     CATARACT EXTRACTION     KNEE SURGERY       OB History   No obstetric history on file.     Family History  Problem Relation Age of Onset   Diabetes Mother    Arthritis Mother    Cancer Father    Cancer Brother        skin cancer    Social History   Tobacco Use   Smoking status: Never   Smokeless tobacco: Never  Substance Use Topics   Drug use: No    Home Medications Prior to Admission medications   Medication Sig Start Date  End Date Taking? Authorizing Provider  albuterol (PROVENTIL HFA;VENTOLIN HFA) 108 (90 Base) MCG/ACT inhaler Inhale 2 puffs into the lungs every 6 (six) hours as needed. 02/08/16   Gale Journey, Damaris Hippo, PA-C  aspirin 81 MG chewable tablet Chew by mouth daily.    [provider]  b complex vitamins tablet Take 1 tablet by mouth daily.    [provider]  budesonide-formoterol (SYMBICORT) 160-4.5 MCG/ACT inhaler Inhale 2 puffs into the lungs 2 (two) times daily. Patient not taking: Reported on 02/08/2016 09/26/14   Roselee Culver, MD  Calcium-Phosphorus-Vitamin D (CALCIUM GUMMIES PO) Take 2 tablets by mouth.    [provider]  celecoxib (CELEBREX) 100 MG capsule Take 1 capsule (100 mg total) by mouth 2 (two) times daily. 01/31/14   Schoenhoff, Altamese Cabal, MD  cholecalciferol (VITAMIN D) 1000 units tablet Take 5,000 Units by mouth daily.    [provider]  cycloSPORINE (RESTASIS) 0.05 % ophthalmic emulsion 1 drop 2 (two) times daily.    [provider]  famotidine (PEPCID) 20 MG tablet Take 20 mg by mouth 2 (two) times daily.    [provider]  guaiFENesin (MUCINEX) 600 MG 12 hr tablet Take by mouth 2 (two) times daily.    [provider]  Multiple Vitamin (MULTIVITAMIN WITH MINERALS) TABS tablet Take 1 tablet by mouth daily.    [provider]  Omega-3 Fatty Acids (OMEGA-3 2100 PO) Take by mouth.    [provider]  PRESCRIPTION MEDICATION 0.5 patches. Combi Patch 50/140 change patch twice weekly    [provider]  Probiotic Product (PROBIOTIC FORMULA PO) Take by mouth.    [provider]  PSYLLIUM PO Take by mouth.    [provider]    Allergies    Levaquin [levofloxacin]  Review of Systems   Review of Systems  Constitutional:  Negative for diaphoresis and fever.  Respiratory:  Negative for shortness of breath.   Cardiovascular:  Positive for chest pain. Negative for leg swelling and syncope.  Gastrointestinal:  Negative for nausea.  Skin:  Negative for color change.  Neurological:  Positive for light-headedness. Negative for syncope and weakness.  All other systems reviewed and are negative.  Physical Exam Updated Vital Signs BP 119/68   Pulse 68   Temp 97.7 F (36.5 C) (Oral)   Resp 17   SpO2 99%   Physical Exam CONSTITUTIONAL: Well developed/well nourished HEAD: Normocephalic/atraumatic EYES: EOMI/PERRL ENMT: Mucous membranes moist NECK: supple no meningeal signs SPINE/BACK:entire spine nontender, left cervical paraspinal tenderness CV: S1/S2 noted, no murmurs/rubs/gallops noted LUNGS: Lungs  are clear to auscultation bilaterally, no apparent distress ABDOMEN: soft, nontender, no rebound or guarding, bowel sounds noted throughout abdomen GU:no cva tenderness NEURO: Pt is awake/alert/appropriate, moves all extremitiesx4.  No facial droop.   Equal power (5/5) with hand grip, wrist flex/extension, elbow flex/extension, and equal power with shoulder abduction/adduction.  No focal sensory deficit to light touch is noted in either UE.   EXTREMITIES: pulses normal/equalx4, full ROM, no calf tenderness/edema SKIN: warm, color normal PSYCH: no abnormalities of mood noted, alert and oriented to situation  ED Results / Procedures / Treatments   Labs (all labs ordered are listed, but only abnormal results are displayed) Labs Reviewed  COMPREHENSIVE METABOLIC PANEL - Abnormal; Notable for the following components:      Result Value   Glucose, Bld 158 (*)    Total Protein 6.1 (*)    All other components within normal limits  CBC  WITH DIFFERENTIAL/PLATELET  TROPONIN I (HIGH SENSITIVITY)  TROPONIN I (HIGH SENSITIVITY)    EKG EKG Interpretation  Date/Time:  Wednesday October 30 2020 13:42:18 EDT Ventricular Rate:  72 PR Interval:  128 QRS Duration: 78 QT Interval:  408 QTC Calculation: 446 R Axis:   37 Text Interpretation: Sinus rhythm with Premature atrial complexes Otherwise normal ECG Confirmed by Ripley Fraise [phone]) on 10/31/2020 2:23:57 AM  Radiology DG Chest 2 View  Result Date: 10/30/2020 CLINICAL DATA:  Chest pain. EXAM: CHEST - 2 VIEW COMPARISON:  February 05, 2011. FINDINGS: The heart size and mediastinal contours are within normal limits. Both lungs are clear. The visualized skeletal structures are unremarkable. IMPRESSION: No active cardiopulmonary disease. Electronically Signed   By: Marijo Conception M.D.   On: 10/30/2020 14:27    Procedures Procedures   Medications Ordered in ED Medications - No data to display  ED Course  I have reviewed the triage vital  signs and the nursing notes.  Pertinent labs & imaging results that were available during my care of the patient were reviewed by me and considered in my medical decision making (see chart for details).    MDM Rules/Calculators/A&P HEAR Score: 3                         Patient is very well-appearing.  Her work-up was unremarkable.  Some of her pain is reproducible in her neck and reports it radiates to her arm.  Question whether this is musculoskeletal/radiculopathy. She already has cardiology follow-up on September 16. I have low Suspicion for ACS/Aortic Dissection/PE at this time We discussed return precautions Final Clinical Impression(s) / ED Diagnoses Final diagnoses:  Precordial pain  Neck pain    Rx / DC Orders ED Discharge Orders     None        Ripley Fraise, MD 10/31/20 0320

## 2020-11-01 ENCOUNTER — Ambulatory Visit: Payer: PPO | Admitting: Internal Medicine

## 2020-11-01 ENCOUNTER — Encounter: Payer: Self-pay | Admitting: Internal Medicine

## 2020-11-01 ENCOUNTER — Other Ambulatory Visit: Payer: Self-pay

## 2020-11-01 VITALS — BP 102/68 | HR 86 | Resp 20 | Ht 63.0 in | Wt 162.6 lb

## 2020-11-01 DIAGNOSIS — G4733 Obstructive sleep apnea (adult) (pediatric): Secondary | ICD-10-CM

## 2020-11-01 DIAGNOSIS — R072 Precordial pain: Secondary | ICD-10-CM

## 2020-11-01 DIAGNOSIS — Z9989 Dependence on other enabling machines and devices: Secondary | ICD-10-CM

## 2020-11-01 DIAGNOSIS — M8580 Other specified disorders of bone density and structure, unspecified site: Secondary | ICD-10-CM | POA: Diagnosis not present

## 2020-11-01 DIAGNOSIS — H35033 Hypertensive retinopathy, bilateral: Secondary | ICD-10-CM | POA: Diagnosis not present

## 2020-11-01 DIAGNOSIS — M1611 Unilateral primary osteoarthritis, right hip: Secondary | ICD-10-CM | POA: Diagnosis not present

## 2020-11-01 DIAGNOSIS — E785 Hyperlipidemia, unspecified: Secondary | ICD-10-CM | POA: Diagnosis not present

## 2020-11-01 DIAGNOSIS — J4520 Mild intermittent asthma, uncomplicated: Secondary | ICD-10-CM | POA: Diagnosis not present

## 2020-11-01 DIAGNOSIS — E7800 Pure hypercholesterolemia, unspecified: Secondary | ICD-10-CM | POA: Diagnosis not present

## 2020-11-01 MED ORDER — IVABRADINE HCL 5 MG PO TABS: 15.0000 mg | ORAL_TABLET | Freq: Once | ORAL | 0 refills | Status: AC

## 2020-11-01 MED ORDER — METOPROLOL TARTRATE 50 MG PO TABS: 50.0000 mg | ORAL_TABLET | Freq: Once | ORAL | 0 refills | Status: DC

## 2020-11-01 NOTE — Patient Instructions (Addendum)
Medication Instructions:  PLEASE TAKE '50mg'$  OF METOPROLOL TARTRATE 2 HOURS PRIOR TO CCTA SCAN   PLEASE ALSO TAKE '15mg'$  IVABRADINE 2 HOURS PRIOR TO CCTA SCAN  *If you need a refill on your cardiac medications before your next appointment, please call your pharmacy*  Lab Work: BMET- [place]  If you have labs (blood work) drawn today and your tests are completely normal, you will receive your results only by: MyChart Message (if you have MyChart) OR A paper copy in the mail If you have any lab test that is abnormal or we need to change your treatment, we will call you to review the results.  Testing/Procedures: Your physician has requested that you have cardiac CT. Cardiac computed tomography (CT) is a painless test that uses an x-ray machine to take clear, detailed pictures of your heart. For further information please visit HugeFiesta.[email or web address]. Please follow instruction sheet as given.   Follow-Up: At [facility], you and your health needs are our priority.  As part of our continuing mission to provide you with exceptional heart care, we have created designated Provider Care Teams.  These Care Teams include your primary Cardiologist (physician) and Advanced Practice Providers (APPs -  Physician Assistants and Nurse Practitioners) who all work together to provide you with the care you need, when you need it.  Your next appointment:   4 WEEKS   The format for your next appointment:   In Person  Provider:   Cherlynn Kaiser, MD  Other Instructions   Your cardiac CT will be scheduled at one of the below locations:   [facility] [address] [place], [place] [address] [phone]  If scheduled at [facility], please arrive at the [facility] main entrance (entrance A) of [facility] 30 minutes prior to test start time. Proceed to the [facility] Radiology Department (first floor) to check-in and test prep.  Please follow these  instructions carefully (unless otherwise directed):  On the Night Before the Test: Be sure to Drink plenty of water. Do not consume any caffeinated/decaffeinated beverages or chocolate 12 hours prior to your test. Do not take any antihistamines 12 hours prior to your test.  On the Day of the Test: Drink plenty of water until 1 hour prior to the test. Do not eat any food 4 hours prior to the test. You may take your regular medications prior to the test.  Take metoprolol (Lopressor) two hours prior to test. HOLD Furosemide/Hydrochlorothiazide morning of the test. FEMALES- please wear underwire-free bra if available, avoid dresses & tight clothing  After the Test: Drink plenty of water. After receiving IV contrast, you may experience a mild flushed feeling. This is normal. On occasion, you may experience a mild rash up to 24 hours after the test. This is not dangerous. If this occurs, you can take Benadryl 25 mg and increase your fluid intake. If you experience trouble breathing, this can be serious. If it is severe call 911 IMMEDIATELY. If it is mild, please call our office. If you take any of these medications: Glipizide/Metformin, Avandament, Glucavance, please do not take 48 hours after completing test unless otherwise instructed.  Please allow 2-4 weeks for scheduling of routine cardiac CTs. Some insurance companies require a pre-authorization which may delay scheduling of this test.   For non-scheduling related questions, please contact the cardiac imaging nurse navigator should you have any questions/concerns: Marchia Bond, Cardiac Imaging Nurse Navigator Gordy Clement, Cardiac Imaging Nurse Navigator  [facility] Direct Office Dial: [phone]   For scheduling needs, including cancellations and rescheduling, please call [place], [phone].

## 2020-11-01 NOTE — Progress Notes (Signed)
Cardiology Office Note:    Date:  11/01/2020   ID:  Cheryl Castillo, DOB 02/22/1955, MRN [id number]  PCP:  Kathyrn Lass, MD  Cardiologist:  None  Electrophysiologist:  None   Referring MD: Kathyrn Lass, MD   Chief Complaint/Reason for Referral: Chest pain  History of Present Illness:    Cheryl Castillo is a 65 y.o. female with a history of OSA on CPAP, HLD, and osteopenia who prsents for evaluation of chest pain. She and her husband ride bikes with Dr. Olevia Perches (retired partner cardiologist) and she mentioned to him that when resting she gest a chest discomfort that can radiate to her left arm and neck. 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 She does not notice this pain with biking and she goes quite a distance. Pain is left chest and substernal. Improves with less activity.   3 times skipped beats - pressure on chest 1-2/10 left upper chest. Thought gas. Occurred over the weekend. Sought opinion of PCP who felt urgent cardiology referral required.  The patient denies dyspnea at rest or with exertion, PND, orthopnea, or leg swelling. Denies cough, fever, chills. Denies nausea, vomiting. Denies syncope or presyncope. Denies dizziness or lightheadedness.   Fhx: brother MI 63s.  Mother HLD.   Past Medical History:  Diagnosis Date   Allergy    Asthma    Cataract    Depression     Past Surgical History:  Procedure Laterality Date   APPENDECTOMY     CATARACT EXTRACTION     KNEE SURGERY      Current Medications: Current Meds  Medication Sig   albuterol (PROVENTIL HFA;VENTOLIN HFA) 108 (90 Base) MCG/ACT inhaler Inhale 2 puffs into the lungs every 6 (six) hours as needed.   amoxicillin-clavulanate (AUGMENTIN) 500-125 MG tablet SMARTSIG:1 Tablet(s) By Mouth Every 12 Hours   aspirin 81 MG chewable tablet Chew by mouth daily.   b complex vitamins tablet Take 1 tablet by mouth daily.   budesonide-formoterol (SYMBICORT) 160-4.5 MCG/ACT inhaler Inhale 2  puffs into the lungs 2 (two) times daily.   Calcium-Phosphorus-Vitamin D (CALCIUM GUMMIES PO) Take 2 tablets by mouth.   celecoxib (CELEBREX) 100 MG capsule Take 1 capsule (100 mg total) by mouth 2 (two) times daily. (Patient taking differently: Take 100 mg by mouth as needed.)   cholecalciferol (VITAMIN D) 1000 units tablet Take 5,000 Units by mouth daily.   cycloSPORINE (RESTASIS) 0.05 % ophthalmic emulsion 1 drop 2 (two) times daily.   estradiol-norethindrone (COMBIPATCH) 0.05-0.14 MG/DAY Place 1 patch onto the skin once as needed. 1/2 patch once weekly   estrogens, conjugated, (PREMARIN) 0.625 MG tablet Take 0.625 mg by mouth 2 (two) times a week. Using 2-3 times weekly   famotidine (PEPCID) 20 MG tablet Take 20 mg by mouth 2 (two) times daily.   MODERNA COVID-19 VACCINE 100 MCG/0.5ML injection    Multiple Vitamin (MULTIVITAMIN WITH MINERALS) TABS tablet Take 1 tablet by mouth daily.   PRESCRIPTION MEDICATION 0.5 patches. Combi Patch 50/140 change patch twice weekly     Allergies:   Levaquin [levofloxacin]   Social History   Tobacco Use   Smoking status: Never   Smokeless tobacco: Never  Substance Use Topics   Drug use: No     Family History: The patient's family history includes Arthritis in her mother; Cancer in her brother and father; Diabetes in her mother.  ROS:   Please see the history of present illness.  All other systems reviewed and are negative.  EKGs/Labs/Other Studies Reviewed:    The following studies were reviewed today:  EKG:  NSR  Imaging studies that I have independently reviewed today: n/a  Recent Labs: 10/30/2020: ALT 20; BUN 15; Creatinine, Ser 0.80; Hemoglobin 12.2; Platelets 289; Potassium 3.8; Sodium 138  Recent Lipid Panel    Component Value Date/Time   CHOL 180 07/04/2013 0922   TRIG 60 07/04/2013 0922   HDL 59 07/04/2013 0922   CHOLHDL 3.1 07/04/2013 0922   VLDL 12 07/04/2013 0922   LDLCALC 109 (H) 07/04/2013 0922    Physical Exam:     VS:  BP 102/68 (BP Location: Left Arm, Patient Position: Sitting, Cuff Size: Normal)   Pulse 86   Resp 20   Ht '5\' 3"'$  (1.6 m)   Wt 162 lb 9.6 oz (73.8 kg)   SpO2 98%   BMI 28.80 kg/m     Wt Readings from Last 5 Encounters:  11/01/20 162 lb 9.6 oz (73.8 kg)  01/25/16 161 lb (73 kg)  10/03/15 157 lb (71.2 kg)  07/18/15 161 lb (73 kg)  09/26/14 161 lb (73 kg)    Constitutional: No acute distress Eyes: sclera non-icteric, normal conjunctiva and lids ENMT: normal dentition, moist mucous membranes Cardiovascular: regular rhythm, normal rate, no murmurs. S1 and S2 normal. Radial pulses normal bilaterally. No jugular venous distention.  Respiratory: clear to auscultation bilaterally GI : normal bowel sounds, soft and nontender. No distention.   MSK: extremities warm, well perfused. No edema.  NEURO: grossly nonfocal exam, moves all extremities. PSYCH: alert and oriented x 3, normal mood and affect.   ASSESSMENT:    1. Precordial pain   2. OSA on CPAP   3. Osteopenia, unspecified location   4. Hyperlipidemia, unspecified hyperlipidemia type    PLAN:    Precordial pain - Plan: EKG 12-Lead, CT CORONARY MORPH W/CTA COR W/SCORE W/CA W/CM &/OR WO/CM, Basic metabolic panel - needs ischemic evaluation given age and possible cardiac chest pain. Discussed in extensive detail with husband and patient, described various modalities for stress testing and determined in shared decision making that CCTA next best test. 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 Advised against travel until ischemic eval complete.   OSA on CPAP - encourage compliance  Hyperlipidemia, unspecified hyperlipidemia type - will determine after CCTA whether medical therapy is warranted.  Total time of encounter: 60 minutes total time of encounter, including 40 minutes spent in face-to-face patient care on the date of this encounter. This time includes coordination of care and counseling regarding  above mentioned problem list. Remainder of non-face-to-face time involved reviewing chart documents/testing relevant to the patient encounter and documentation in the medical record. I have independently reviewed documentation from referring provider.   Cherlynn Kaiser, MD, [place]   Shared Decision Making/Informed Consent:       Medication Adjustments/Labs and Tests Ordered: Current medicines are reviewed at length with the patient today.  Concerns regarding medicines are outlined above.   Orders Placed This Encounter  Procedures   CT CORONARY MORPH W/CTA COR W/SCORE W/CA W/CM &/OR WO/CM   Basic metabolic panel   EKG [id number]    Meds ordered this encounter  Medications   ivabradine (CORLANOR) 5 MG TABS tablet    Sig: Take 3 tablets (15 mg total) by mouth once for 1 dose. PLEASE TAKE '15mg'$  (3) TABLETS TWO HOURS PRIOR TO CCTA SCAN  Dispense:  3 tablet    Refill:  0   metoprolol tartrate (LOPRESSOR) 50 MG tablet    Sig: Take 1 tablet (50 mg total) by mouth once for 1 dose. PLEASE TAKE METOPROLOL 2  HOURS PRIOR TO CTA SCAN.    Dispense:  1 tablet    Refill:  0    Patient Instructions  Medication Instructions:  PLEASE TAKE '50mg'$  OF METOPROLOL TARTRATE 2 HOURS PRIOR TO CCTA SCAN   PLEASE ALSO TAKE '15mg'$  IVABRADINE 2 HOURS PRIOR TO CCTA SCAN  *If you need a refill on your cardiac medications before your next appointment, please call your pharmacy*  Lab Work: BMET- [place]  If you have labs (blood work) drawn today and your tests are completely normal, you will receive your results only by: MyChart Message (if you have MyChart) OR A paper copy in the mail If you have any lab test that is abnormal or we need to change your treatment, we will call you to review the results.  Testing/Procedures: Your physician has requested that you have cardiac CT. Cardiac computed tomography (CT) is a painless test that uses an x-ray machine to take clear,  detailed pictures of your heart. For further information please visit HugeFiesta.[email or web address]. Please follow instruction sheet as given.   Follow-Up: At [facility], you and your health needs are our priority.  As part of our continuing mission to provide you with exceptional heart care, we have created designated Provider Care Teams.  These Care Teams include your primary Cardiologist (physician) and Advanced Practice Providers (APPs -  Physician Assistants and Nurse Practitioners) who all work together to provide you with the care you need, when you need it.  Your next appointment:   4 WEEKS   The format for your next appointment:   In Person  Provider:   Cherlynn Kaiser, MD  Other Instructions   Your cardiac CT will be scheduled at one of the below locations:   [facility] [address] [place], [place] [address] [phone]  If scheduled at [facility], please arrive at the [facility] main entrance (entrance A) of [facility] 30 minutes prior to test start time. Proceed to the [facility] Radiology Department (first floor) to check-in and test prep.  Please follow these instructions carefully (unless otherwise directed):  On the Night Before the Test: Be sure to Drink plenty of water. Do not consume any caffeinated/decaffeinated beverages or chocolate 12 hours prior to your test. Do not take any antihistamines 12 hours prior to your test.  On the Day of the Test: Drink plenty of water until 1 hour prior to the test. Do not eat any food 4 hours prior to the test. You may take your regular medications prior to the test.  Take metoprolol (Lopressor) two hours prior to test. HOLD Furosemide/Hydrochlorothiazide morning of the test. FEMALES- please wear underwire-free bra if available, avoid dresses & tight clothing  After the Test: Drink plenty of water. After receiving IV contrast, you may experience a mild flushed feeling. This is  normal. On occasion, you may experience a mild rash up to 24 hours after the test. This is not dangerous. If this occurs, you can take Benadryl 25 mg and increase your fluid intake. If you experience trouble breathing, this can be serious. If it is severe call 911 IMMEDIATELY. If it is mild, please call our office. If you take any of these medications: Glipizide/Metformin, Avandament, Glucavance, please do  not take 48 hours after completing test unless otherwise instructed.  Please allow 2-4 weeks for scheduling of routine cardiac CTs. Some insurance companies require a pre-authorization which may delay scheduling of this test.   For non-scheduling related questions, please contact the cardiac imaging nurse navigator should you have any questions/concerns: Marchia Bond, Cardiac Imaging Nurse Navigator Gordy Clement, Cardiac Imaging Nurse Navigator  [facility] Direct Office Dial: [phone]   For scheduling needs, including cancellations and rescheduling, please call [place], [phone].

## 2020-11-04 ENCOUNTER — Telehealth: Payer: Self-pay | Admitting: Internal Medicine

## 2020-11-04 NOTE — Telephone Encounter (Signed)
Returned call to patient who states that she is concerned about her pre medications for her CCTA scan as she has checked her HR on multiple occaisions at home and her resting HR has been 59-64 BPM. Patient states her BP also runs a little lower as well with SBP in the low 100's over 0000000 diastolic. Patient would like to know what she should do regarding the '50mg'$  of metoprolol ordered and '15mg'$  of ivabradine as her HR at last OV was 86 BPM but now reports HR in the low 60's. Patient also states that she heard from her pharmacy that her insurance is not wanting to cover the Ivabradine. Advised patient I would forward message to PharmD to review and advise on a different dosage of medication and samples of ivabradine if possible. Advised patient someone would be in touch to clarify dosage. Patient verbalized understanding.

## 2020-11-04 NOTE — Telephone Encounter (Signed)
Calling to get urgent medical clearance for medication Corlanor for patient. Its not cover under the patient insurance. Office stated that is urgent because it expires in the morning. Please advise

## 2020-11-04 NOTE — Telephone Encounter (Signed)
Attempted to contact- had a 10 minute wait time. Our PharmD here was working on this,  per Dr.Acharya's nurse. Will route to PharmD to make aware.

## 2020-11-04 NOTE — Telephone Encounter (Signed)
Hi,  Pt has some questions in regards to her upcoming procedure and would like to speak with you... please advise

## 2020-11-05 ENCOUNTER — Telehealth (HOSPITAL_COMMUNITY): Payer: Self-pay | Admitting: Emergency Medicine

## 2020-11-05 ENCOUNTER — Other Ambulatory Visit: Payer: Self-pay | Admitting: Pharmacist Clinician (PhC)/ Clinical Pharmacy Specialist

## 2020-11-05 ENCOUNTER — Other Ambulatory Visit (HOSPITAL_COMMUNITY): Payer: Self-pay

## 2020-11-05 MED ORDER — IVABRADINE HCL 5 MG PO TABS
ORAL_TABLET | ORAL | 0 refills | Status: DC
  Filled 2020-11-05: qty 2, 1d supply, fill #0

## 2020-11-05 NOTE — Telephone Encounter (Signed)
Reviewed with Dr. Margaretann Loveless.  Based on patient lower HR readings at home, will give just 10 mg ivabradine w/o metoprolol.  Rx sent to [facility] outpatient pharmacy

## 2020-11-05 NOTE — Telephone Encounter (Signed)
Last OV VITALS :  BP 102/68 (BP Location: Left Arm, Patient Position: Sitting, Cuff Size: Normal) Pulse 86  Extensive conversation with patient this morning regarding her resting HR at home vs what was charted during OV. I asked if she was nervous during last OV and she said no, that she was carrying a heavy bag and trying not to miss a flight. But otherwise does not have doctor anxiety or test anxiety.   Pt states she wears an apple watch and monitors her HR closely. While on phone it was between 66-74bpm. Pt stated also that her BP yesterday was 384 systolic.   I explained the CCTA procedure and why the medications (metoprolol and ivabradine) were prescribed and the HR goal for a good scan (55-65bpm). She verbalized understanding. I also explained that we will be giving 2 SL NTG for vasodilation and optimal opacification. She verbalized understanding.   I explained to her that because we want to preserve her BP in order to give nitro and not cause her to be too hypotensive and syncopal, that she only take ivabradine for HR control (not take metoprolol). Since ivabradine does not effect the BP. I also explained that she may have to purchase these out of pocket since its a one time dose and not being used as a heart failure drug. She verbalized understanding and is okay to purchase out of pocket  50-60bpm- no meds 60-70- 5mg  ivabradine >70bpm - 10mg  ivabradine  Pt repeated these instructions back to me and verbalized understanding.   Pt was given my phone number for further questions should they arise.   Marchia Bond RN Navigator Cardiac Imaging [facility] [phone] Office  [phone] Cell

## 2020-11-06 ENCOUNTER — Other Ambulatory Visit: Payer: Self-pay

## 2020-11-06 ENCOUNTER — Encounter (HOSPITAL_COMMUNITY): Payer: Self-pay

## 2020-11-06 ENCOUNTER — Ambulatory Visit (HOSPITAL_COMMUNITY)
Admission: RE | Admit: 2020-11-06 | Discharge: 2020-11-06 | Disposition: A | Discharge status: Home / Self Care | Payer: PPO | Source: Ambulatory Visit | Attending: Internal Medicine | Admitting: Internal Medicine

## 2020-11-06 DIAGNOSIS — R072 Precordial pain: Secondary | ICD-10-CM | POA: Diagnosis not present

## 2020-11-06 MED ORDER — IOHEXOL 350 MG/ML SOLN
100.0000 mL | Freq: Once | INTRAVENOUS | Status: AC | PRN
  Administered 2020-11-06: 100 mL via INTRAVENOUS

## 2020-11-06 MED ORDER — NITROGLYCERIN 0.4 MG SL SUBL
SUBLINGUAL_TABLET | SUBLINGUAL | Status: AC
  Filled 2020-11-06: qty 2

## 2020-11-06 MED ORDER — NITROGLYCERIN 0.4 MG SL SUBL: 0.4000 mg | SUBLINGUAL_TABLET | Freq: Once | SUBLINGUAL | Status: DC

## 2020-11-06 NOTE — Telephone Encounter (Signed)
See other encounters around same date.  Patient spoke with CCTA coordinator and was given specific instructions on dose of ivabradine based on HR morning of procedure.  I spoke with her after that and she and her husband had both reviewed this information.

## 2020-11-13 DIAGNOSIS — H524 Presbyopia: Secondary | ICD-10-CM | POA: Diagnosis not present

## 2020-11-13 DIAGNOSIS — H40013 Open angle with borderline findings, low risk, bilateral: Secondary | ICD-10-CM | POA: Diagnosis not present

## 2020-11-18 DIAGNOSIS — Z6828 Body mass index (BMI) 28.0-28.9, adult: Secondary | ICD-10-CM | POA: Diagnosis not present

## 2020-11-18 DIAGNOSIS — Z1231 Encounter for screening mammogram for malignant neoplasm of breast: Secondary | ICD-10-CM | POA: Diagnosis not present

## 2020-11-18 DIAGNOSIS — N390 Urinary tract infection, site not specified: Secondary | ICD-10-CM | POA: Diagnosis not present

## 2020-11-18 DIAGNOSIS — Z124 Encounter for screening for malignant neoplasm of cervix: Secondary | ICD-10-CM | POA: Diagnosis not present

## 2020-11-19 DIAGNOSIS — L918 Other hypertrophic disorders of the skin: Secondary | ICD-10-CM | POA: Diagnosis not present

## 2020-11-19 DIAGNOSIS — Z85828 Personal history of other malignant neoplasm of skin: Secondary | ICD-10-CM | POA: Diagnosis not present

## 2020-11-19 DIAGNOSIS — L578 Other skin changes due to chronic exposure to nonionizing radiation: Secondary | ICD-10-CM | POA: Diagnosis not present

## 2020-11-19 DIAGNOSIS — B078 Other viral warts: Secondary | ICD-10-CM | POA: Diagnosis not present

## 2020-11-19 DIAGNOSIS — L309 Dermatitis, unspecified: Secondary | ICD-10-CM | POA: Diagnosis not present

## 2020-11-19 DIAGNOSIS — L570 Actinic keratosis: Secondary | ICD-10-CM | POA: Diagnosis not present

## 2020-11-19 DIAGNOSIS — D2271 Melanocytic nevi of right lower limb, including hip: Secondary | ICD-10-CM | POA: Diagnosis not present

## 2020-11-19 DIAGNOSIS — Z808 Family history of malignant neoplasm of other organs or systems: Secondary | ICD-10-CM | POA: Diagnosis not present

## 2020-11-19 DIAGNOSIS — L821 Other seborrheic keratosis: Secondary | ICD-10-CM | POA: Diagnosis not present

## 2020-11-19 DIAGNOSIS — D2262 Melanocytic nevi of left upper limb, including shoulder: Secondary | ICD-10-CM | POA: Diagnosis not present

## 2020-11-27 ENCOUNTER — Ambulatory Visit: Payer: PPO | Admitting: Internal Medicine

## 2020-12-05 DIAGNOSIS — M1611 Unilateral primary osteoarthritis, right hip: Secondary | ICD-10-CM | POA: Diagnosis not present

## 2020-12-05 DIAGNOSIS — J4520 Mild intermittent asthma, uncomplicated: Secondary | ICD-10-CM | POA: Diagnosis not present

## 2020-12-05 DIAGNOSIS — E7800 Pure hypercholesterolemia, unspecified: Secondary | ICD-10-CM | POA: Diagnosis not present

## 2020-12-05 DIAGNOSIS — H35033 Hypertensive retinopathy, bilateral: Secondary | ICD-10-CM | POA: Diagnosis not present

## 2020-12-05 DIAGNOSIS — M8580 Other specified disorders of bone density and structure, unspecified site: Secondary | ICD-10-CM | POA: Diagnosis not present

## 2021-10-17 IMAGING — MR MR LUMBAR SPINE W/O CM
4 of 5 series · 18 of 48 positions shown · non-contrast
Comparison: None.

CLINICAL DATA: Lumbar spinal stenosis without neurogenic
claudication. Pain extends into the left leg

EXAM:
MRI LUMBAR SPINE WITHOUT CONTRAST
TECHNIQUE: Multiplanar, multisequence MR imaging of the lumbar spine was
performed. No intravenous contrast was administered.

[Series 6: T2 · sagittal · 4.0mm · 0.73mm/px · 6 of 15 slices shown (1 of 2)]
[im 1/15]
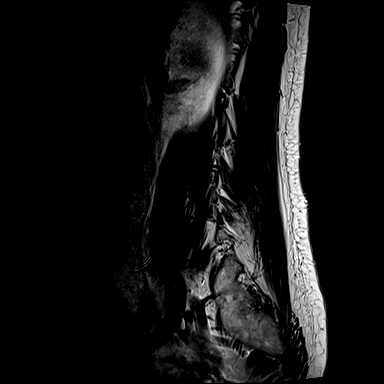
[im 3/15]
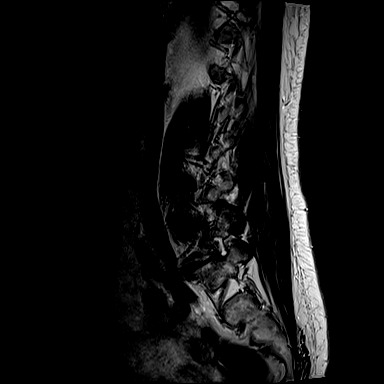
[im 6/15]
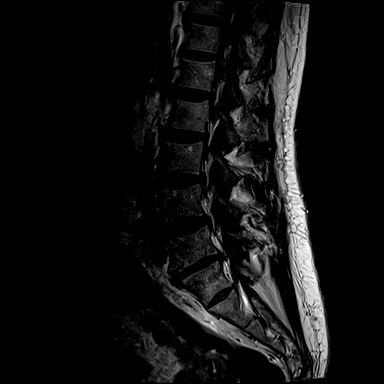
[im 9/15]
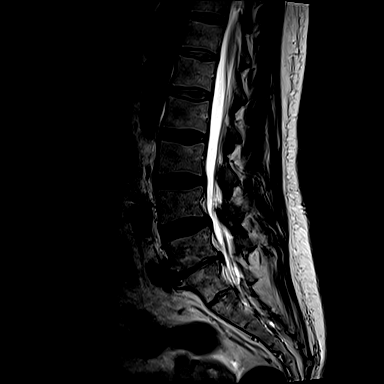
[im 12/15]
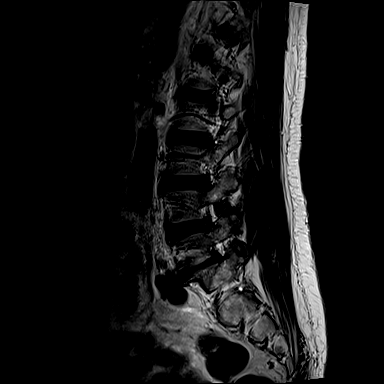
[im 15/15]
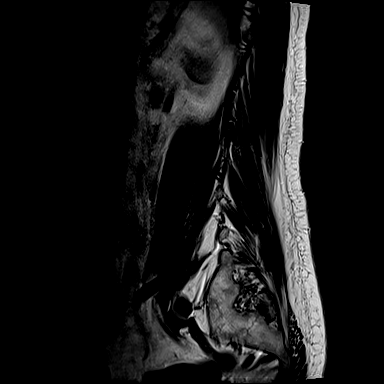

[Series 7: T1 · sagittal · 4.0mm · 0.73mm/px · 3 of 15 slices shown (1 of 2)]
[im 3/15]
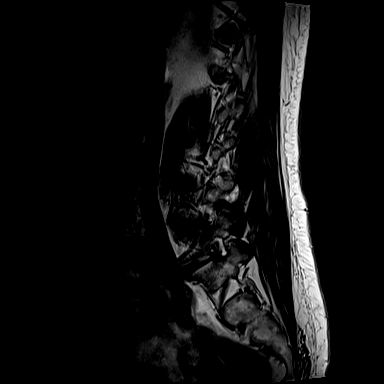
[im 9/15]
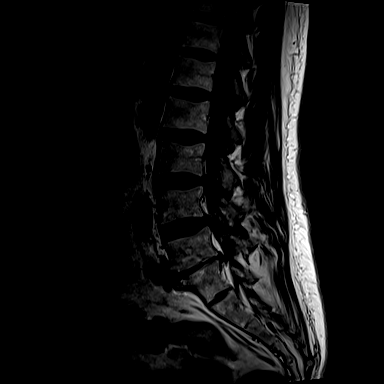
[im 15/15]
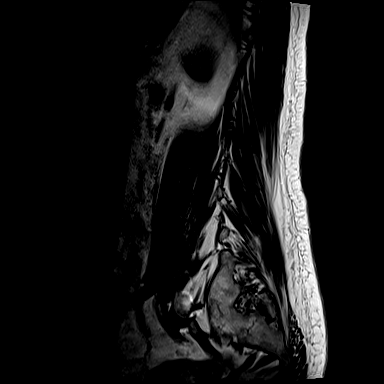

[Series 13: T2 · axial · 4.0mm · 0.28mm/px · z∈[-59,+127]mm · 6 of 40 slices shown (2 of 2)]
[im 1/40]
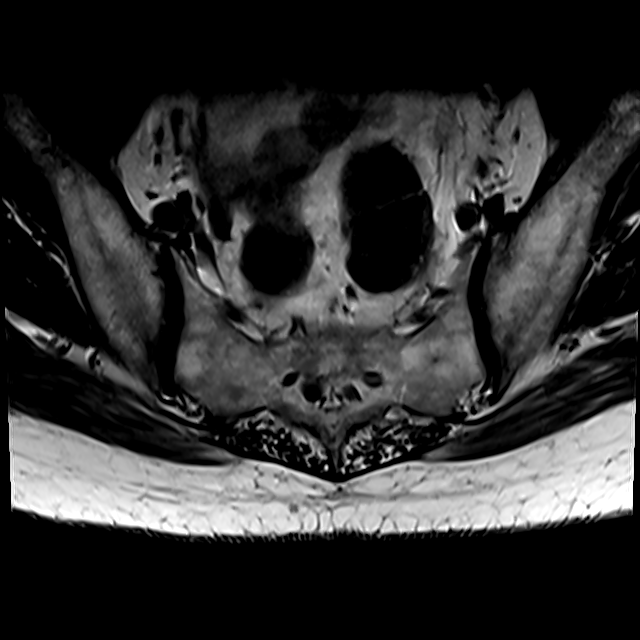
[im 6/40]
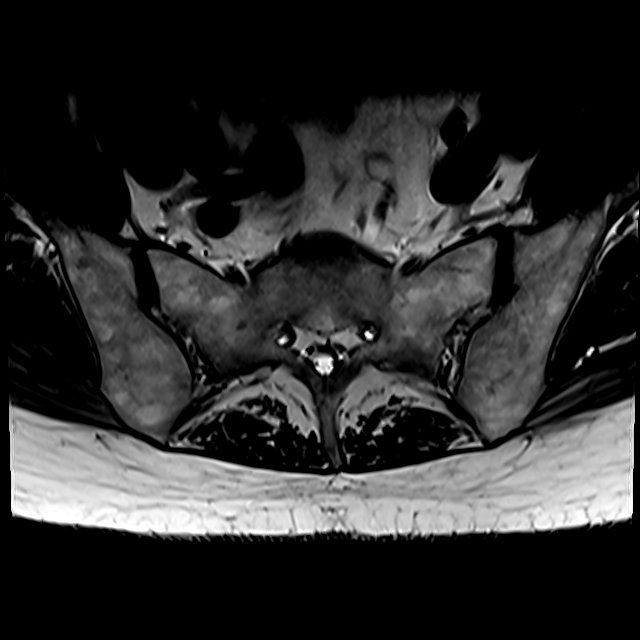
[im 12/40]
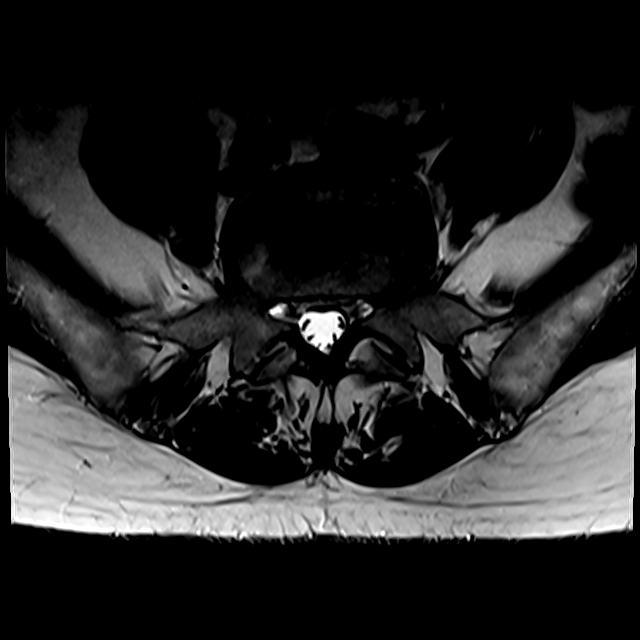
[im 17/40]
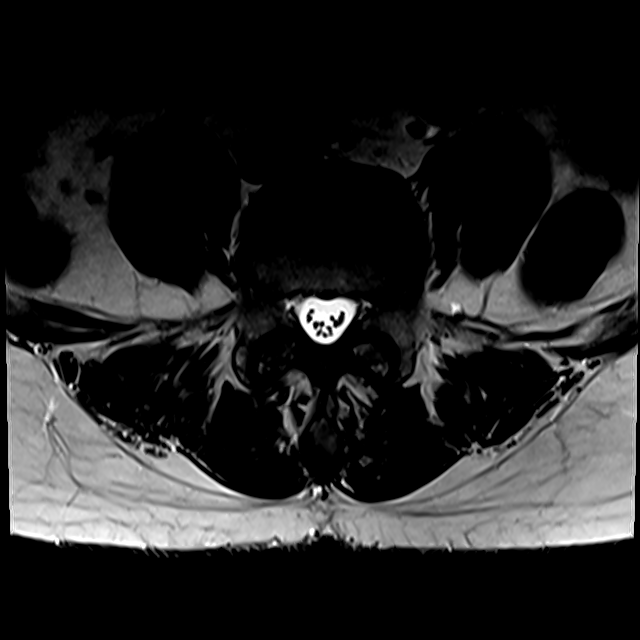
[im 20/40]
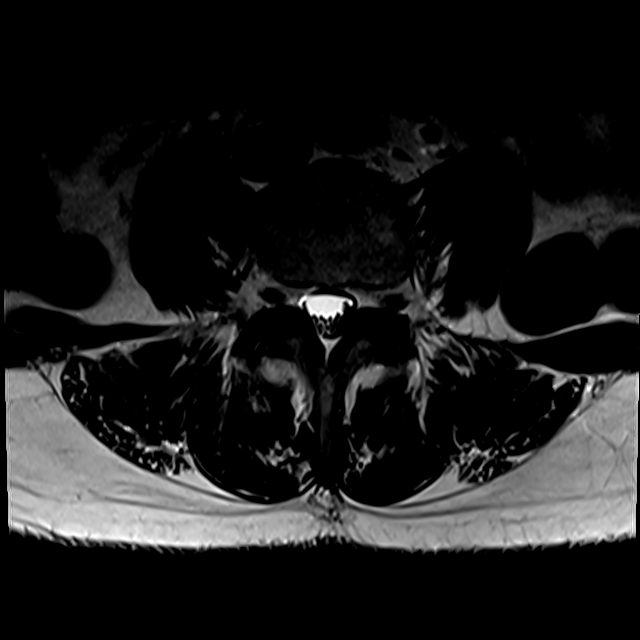
[im 34/40]
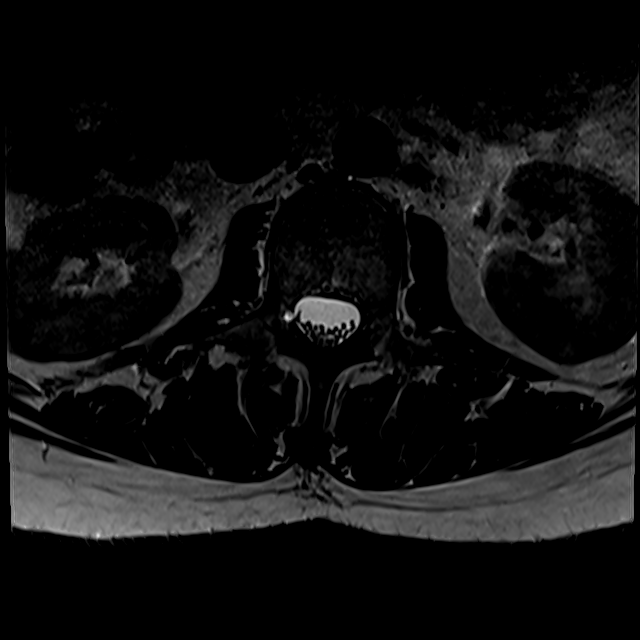

[Series 100: T1 · axial · 4.0mm · 0.28mm/px · z∈[-34,+127]mm · 3 of 40 slices shown (2 of 2)]
[im 6/40]
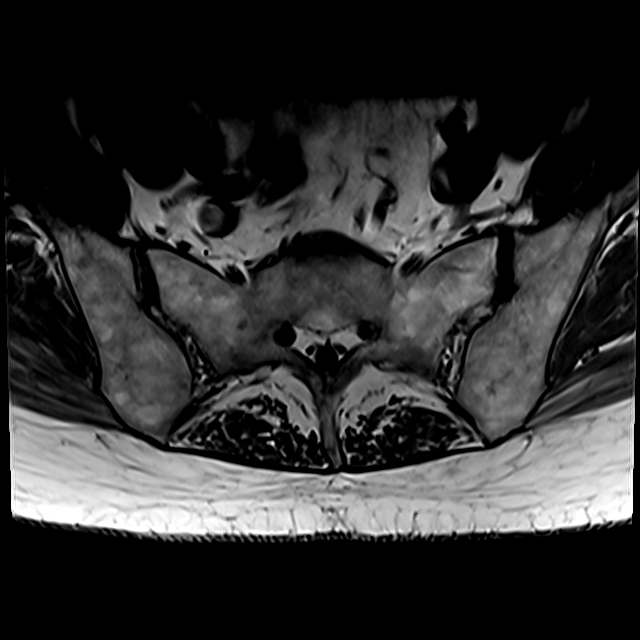
[im 20/40]
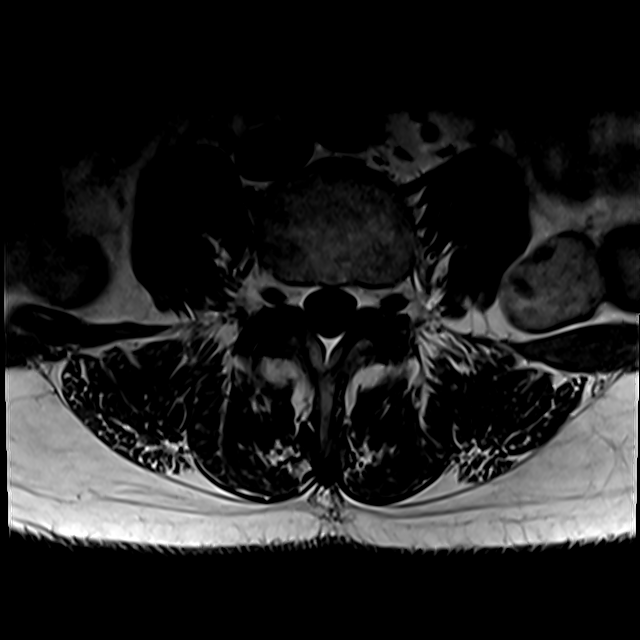
[im 34/40]
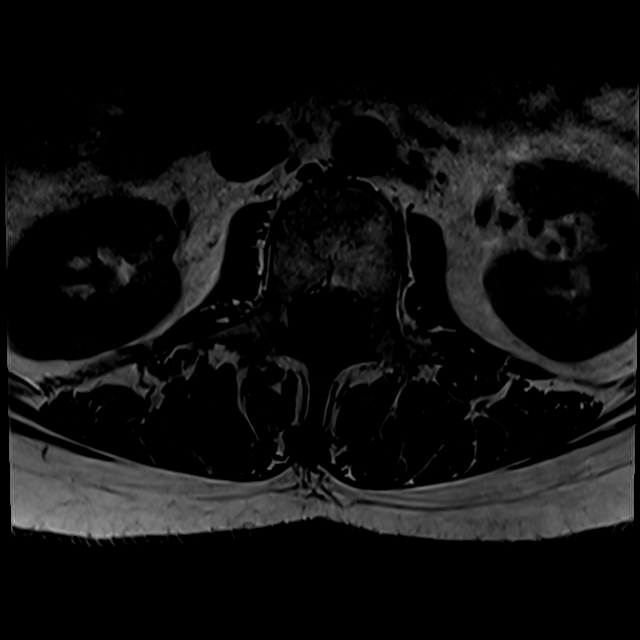

[18 of 48 positions shown; findings below may reference images not displayed]

FINDINGS: Segmentation:  Transitional lumbosacral vertebra numbered S1.

Alignment:  Slight anterolisthesis at L4-5.

Vertebrae:  No fracture, evidence of discitis, or bone lesion.

Conus medullaris and cauda equina: Conus extends to the L1 level.
Conus and cauda equina appear normal.

Paraspinal and other soft tissues: Negative

Disc levels:

T12- L1: Unremarkable.

L1-L2: Unremarkable.

L2-L3: Mild left foraminal disc bulging.  No neural compression

L3-L4: Disc bulging into the foramina with a left foraminal
protrusion which contacts but does not compress the L3 nerve root
based on sagittal images.

L4-L5: Biforaminal disc bulging and mild facet spurring. Mild
degenerative facet spurring on both sides. No neural compression

L5-S1:Advanced disc narrowing with mild bulging and ridging. Mild
left foraminal stenosis.
IMPRESSION: 1. Transitional vertebra numbered S1.
2. Disc degeneration from L2-3 to L4-5 with disc material effacing
the inferior foramina, especially on the left at L3-4.
3. Mild facet osteoarthritis at L4-5.

## 2022-09-17 ENCOUNTER — Ambulatory Visit: Payer: Medicare Other | Admitting: Sports Medicine

## 2022-09-17 ENCOUNTER — Other Ambulatory Visit (INDEPENDENT_AMBULATORY_CARE_PROVIDER_SITE_OTHER): Payer: Medicare Other

## 2022-09-17 ENCOUNTER — Encounter: Payer: Self-pay | Admitting: Sports Medicine

## 2022-09-17 DIAGNOSIS — M7071 Other bursitis of hip, right hip: Secondary | ICD-10-CM | POA: Diagnosis not present

## 2022-09-17 DIAGNOSIS — Z96641 Presence of right artificial hip joint: Secondary | ICD-10-CM | POA: Diagnosis not present

## 2022-09-17 DIAGNOSIS — M25551 Pain in right hip: Secondary | ICD-10-CM | POA: Diagnosis not present

## 2022-09-17 DIAGNOSIS — M722 Plantar fascial fibromatosis: Secondary | ICD-10-CM

## 2022-09-17 NOTE — Progress Notes (Signed)
PCP: Sigmund Hazel, MD  Subjective:   HPI: Patient is a 67 y.o. female here for Right-sided groin pain.  Patient is an avid biker and is very active.  Patient did have bilateral hip replacements done.  Patient's painful side is the right side and she is notes that the pain is located near the anterior groin portion.  Patient's right-sided hip replacement was done approximately 3 years ago.  Patient states that whenever she is trying to swing her leg over her bike or whenever she is trying to get into bed she has to forcefully lift up her leg into flexion and bring it onto the area.  Patient states that whenever she does it on her own she has pain right there in the anterior groin.  Patient denies any pain whenever she bikes.  Patient denies any trauma to the area that she can recall.  Patient denies any fevers or chills and states that there is no other pain around the hip area.  Patient has been doing physical therapy for the area and notes that it has been improving slightly.  Patient bikes approximately 3 times a week for about an hour and a half. Patient also notes she has been dealing with some plantar fasciitis episodes lately.  Her left foot has been hurting her more than her right.  Patient does wear orthotics but states that they are old and is unsure when they were done.  Patient states that her foot pain has been bothering her most at the end of the day and is located on the plantar side of her foot.   Past Medical History:  Diagnosis Date   Allergy    Asthma    Cataract    Depression     Current Outpatient Medications on File Prior to Visit  Medication Sig Dispense Refill   albuterol (PROVENTIL HFA;VENTOLIN HFA) 108 (90 Base) MCG/ACT inhaler Inhale 2 puffs into the lungs every 6 (six) hours as needed. 1 Inhaler 2   amoxicillin-clavulanate (AUGMENTIN) 500-125 MG tablet SMARTSIG:1 Tablet(s) By Mouth Every 12 Hours     aspirin 81 MG chewable tablet Chew by mouth daily.     b complex  vitamins tablet Take 1 tablet by mouth daily.     budesonide-formoterol (SYMBICORT) 160-4.5 MCG/ACT inhaler Inhale 2 puffs into the lungs 2 (two) times daily. 1 Inhaler 3   Calcium-Phosphorus-Vitamin D (CALCIUM GUMMIES PO) Take 2 tablets by mouth.     celecoxib (CELEBREX) 100 MG capsule Take 1 capsule (100 mg total) by mouth 2 (two) times daily. (Patient taking differently: Take 100 mg by mouth as needed.) 30 capsule 0   cholecalciferol (VITAMIN D) 1000 units tablet Take 5,000 Units by mouth daily.     cycloSPORINE (RESTASIS) 0.05 % ophthalmic emulsion 1 drop 2 (two) times daily.     estradiol-norethindrone (COMBIPATCH) 0.05-0.14 MG/DAY Place 1 patch onto the skin once as needed. 1/2 patch once weekly     estrogens, conjugated, (PREMARIN) 0.625 MG tablet Take 0.625 mg by mouth 2 (two) times a week. Using 2-3 times weekly     famotidine (PEPCID) 20 MG tablet Take 20 mg by mouth 2 (two) times daily.     ivabradine (CORLANOR) 5 MG TABS tablet Take 2 tablets (10 mg) by mouth 2 hours prior to CCTA scan 2 tablet 0   metoprolol tartrate (LOPRESSOR) 50 MG tablet Take 1 tablet (50 mg total) by mouth once for 1 dose. PLEASE TAKE METOPROLOL 2  HOURS PRIOR TO CTA SCAN. 1  tablet 0   MODERNA COVID-19 VACCINE 100 MCG/0.5ML injection      Multiple Vitamin (MULTIVITAMIN WITH MINERALS) TABS tablet Take 1 tablet by mouth daily.     PRESCRIPTION MEDICATION 0.5 patches. Combi Patch 50/140 change patch twice weekly     No current facility-administered medications on file prior to visit.    Past Surgical History:  Procedure Laterality Date   APPENDECTOMY     CATARACT EXTRACTION     KNEE SURGERY      Allergies  Allergen Reactions   Levaquin [Levofloxacin]     Joint Pain    There were no vitals taken for this visit.      No data to display              No data to display         XR HIP UNILAT W OR W/O PELVIS 2-3 VIEWS RIGHT X-ray of right hip done in AP and lateral.  Acetabular cup appears to  be  positioned in appropriate position.  Femoral stem is aligned with the  femoral access without any varus or valgus angulation. No obvious hardware  abnormalities that would suggest any loosening bilaterally.  Procedures:  Procedure: ECSWT Indications:  Right Iliopsoas tendinopathy, Left plantar fascitis   Procedure Details Consent: Risks of procedure as well as the alternatives and risks of each were explained to the patient.  Written consent for procedure obtained. Time Out: Verified patient identification, verified procedure, site was marked, verified correct patient position, medications/allergies/relevent history reviewed.  The area was cleaned with alcohol swab.     The Right iliopsoas was targeted for Extracorporeal shockwave therapy.    Preset: Muscle Injury  Power Level: 120 Frequency: 14 Impulse/cycles: 2500 Head size: Regular   The left plantar fascia was targeted for Extracorporeal shockwave therapy.    Preset: Plantar fascia Power Level: 90 Frequency: 10 Impulse/cycles: 2200 Head size: Regular   Patient tolerated procedure well without immediate complications         Objective:  Physical Exam:  Gen: NAD, comfortable in exam room  Hip, Right:  No obvious rash, erythema, ecchymosis, or edema. Passive Log Roll equivalent b/l without restriction. ROM full in all directions; Strength 5/5 in IR/ER/Flex/Ext/Abd/Add. There is some pain in the groin region with active resistance against hip flexion. Pelvic alignment unremarkable to inspection and palpation. Non-antalgic gait without trendelenburg / unsteadiness. Greater trochanter without tenderness to palpation. No tenderness over piriformis. No SI joint tenderness and normal minimal SI movement. With patient standing, there is noted pes planus of bilateral foot.  Provocative Testing:    - FABER/FADIR test: FABER negative, FADIR positive   Assessment & Plan:  1. Pain in right hip - history of right hip THA x 3  years ago -Patient's physical exam is most likely consistent with inflammation of the hip flexor muscles.  Patient's tenderness is located at the iliopsoas muscle so we will recommend shockwave therapy at that location at this time. Patient would benefit from follow-up session as well.  Patient return in 1 week for repeat shockwave therapy of the iliopsoas. Would also recommend patient continue working with physical therapy.  - XR HIP UNILAT W OR W/O PELVIS 2-3 VIEWS RIGHT   2. Plantar fasciitis, bilateral - Patient's foot pain is likely related to plantar fasciitis of the left foot.  Would also recommend shockwave therapy at this time. Follow up 1 week for repeat shockwave. - Patient advised to bring her orthotics during next office visit  Medical  Fellow - Attending Physician Addendum:   I have independently interviewed and examined the patient myself. I have discussed the above with the original author and agree with their documentation. My edits for correction/addition/clarification have been made, see any changes above and below.   IN addition - Right hip pain most just above the iliopsoas tendinopathy/bursa apathy given a prior right total hip arthroplasty.  Will treat with extracorporeal shockwave in formalized physical therapy.  If for some reason she is not making improvements, we could consider ultrasound to evaluate the hip flexors.  Treating for plantar fasciitis with use ESWT, continue home exercise, stretching and orthotics.  She will bring these in for her next visit.  I spent 46 minutes in the care of the patient today including face-to-face time, preparation to see the patient, as well as review of previous notes pertaining to her hip replacement, review of current and previous imaging, discussion on gait analysis and orthotic use, time spent explaining in proceeding with extracorporeal shockwave therapy for the above diagnoses.   Madelyn Brunner, DO Primary Care Sports Medicine  Physician  [facility] [place] - Orthopedics

## 2022-09-17 NOTE — Progress Notes (Signed)
Pain in her groin Pain is only when she raises her leg . She has been doing PT and it has gotten better.  She's hoping shockwave will help.

## 2022-09-30 ENCOUNTER — Encounter: Payer: Self-pay | Admitting: Sports Medicine

## 2022-09-30 ENCOUNTER — Ambulatory Visit (INDEPENDENT_AMBULATORY_CARE_PROVIDER_SITE_OTHER): Payer: Medicare Other | Admitting: Sports Medicine

## 2022-09-30 DIAGNOSIS — Z96641 Presence of right artificial hip joint: Secondary | ICD-10-CM | POA: Diagnosis not present

## 2022-09-30 DIAGNOSIS — M25551 Pain in right hip: Secondary | ICD-10-CM

## 2022-09-30 DIAGNOSIS — M722 Plantar fascial fibromatosis: Secondary | ICD-10-CM

## 2022-09-30 DIAGNOSIS — M7071 Other bursitis of hip, right hip: Secondary | ICD-10-CM | POA: Diagnosis not present

## 2022-09-30 NOTE — Progress Notes (Signed)
Cheryl Castillo - 67 y.o. female MRN [id number]  Date of birth: 1956-01-08  Office Visit Note: Visit Date: 09/30/2022 PCP: Sigmund Hazel, MD Referred by: Sigmund Hazel, MD  Subjective: Chief Complaint  Patient presents with   Right Hip - Follow-up   Left Foot - Follow-up   HPI: Cheryl Castillo is a pleasant 67 y.o. female who presents today for f/u of right anterior hip pain, left plantar fasciitis.  Right anterior hip -history of prior THA over 3 years ago.  Perform 1 session of extracorporeal shockwave about 2 weeks ago and she had quite excellent relief of her pain.  Given that she went hiking on Sunday and this certainly exacerbated her hip pain as well as her left heel pain. She is dealing with plantar fasciitis, has been hobbling because of the pain after hiking recently.  She is using Celebrex 200 mg once daily.  She does have orthotics in her shoes, but states they are old.  Does wear Oofos sandals around the house.  She is doing physical therapy.  She and her husband do cycle for exercise, this does not bother her hip. Her husband is having knee replacement upcoming on 10/15/2022, she will need to be with him at home for recovery.  Pertinent ROS were reviewed with the patient and found to be negative unless otherwise specified above in HPI.   Assessment & Plan: Visit Diagnoses:  1. Iliopsoas bursitis of right hip   2. Pain in right hip   3. Plantar fasciitis of left foot   4. History of arthroplasty of right hip    Plan: Rasheda did receive quite good relief from shockwave therapy for the anterior hip and the plantar fascia, unfortunately she had a setback with exacerbation of her plantar fasciitis after hiking over the weekend.  Discussed appropriate activity modification over this next week, okay for cycling however.  She will take Celebrex 200 mg once daily.  She will continue her formalized physical therapy as well as this has been helpful.  We did repeat extracorporeal shockwave  therapy for the right anterior hip/iliopsoas and her left plantar fascia today.  We will see her back next week for repeat treatment and evaluation.  Did discuss the role of injection into the plantar fascia for her right iliopsoas tendon if she is not getting positive response from ESWT.  Hopefully her flare for the plantar fascia may settle down, we could consider low-dose of prednisone to help with inflammatory cause at next visit if not improving.  Follow-up: Return in about 1 week (around 10/07/2022) for for R-ant hip, L-PF (reg visit).   Meds & Orders: No orders of the defined types were placed in this encounter.  No orders of the defined types were placed in this encounter.    Procedures: Procedure: ECSWT Indications:  Right Iliopsoas tendinopathy, Left plantar fascitis   Procedure Details Consent: Risks of procedure as well as the alternatives and risks of each were explained to the patient.  Written consent for procedure obtained. Time Out: Verified patient identification, verified procedure, site was marked, verified correct patient position, medications/allergies/relevent history reviewed.  The area was cleaned with alcohol swab.     The Right iliopsoas was targeted for Extracorporeal shockwave therapy.    Preset: Muscle Injury  Power Level: 120  Frequency: 14 Impulse/cycles: 2500 Head size: Regular   The left plantar fascia was targeted for Extracorporeal shockwave therapy.    Preset: Plantar fascia Power Level: 90 Frequency: 10 Impulse/cycles: 2300  Head size: Regular    Patient tolerated procedure well without immediate complications      Clinical History: No specialty comments available.  She reports that she has never smoked. She has never used smokeless tobacco. No results for input(s): "HGBA1C", "LABURIC" in the last 8760 hours.  Objective:   Vital Signs: There were no vitals taken for this visit.  Physical Exam  Gen: Well-appearing, in no acute distress;  non-toxic CV: Well-perfused. Warm.  Resp: Breathing unlabored on room air; no wheezing. Psych: Fluid speech in conversation; appropriate affect; normal thought process Neuro: Sensation intact throughout. No gross coordination deficits.   Ortho Exam - Right hip: No redness swelling or effusion.  Passive logroll equivalent without restriction in internal or external range of motion.  -Left heel: Positive TTP with mild soft tissue swelling near the insertion of the plantar fascia.  Negative calcaneal heel squeeze.  Full range of motion about the ankle.  There is pes planus noted of bilateral feet.  Imaging: No results found.  Past Medical/Family/Surgical/Social History: Medications & Allergies reviewed per EMR, new medications updated. Patient Active Problem List   Diagnosis Date Noted   OSA on CPAP 07/18/2015   Cataract 07/27/2013   Thyroid nodule  bx done 06/2013 07/05/2013   Osteopenia 05/08/2013   Menopause 05/08/2013   Allergic rhinitis with asthma without status asthmaticus 12/03/2011   Hx of bacterial pneumonia 12/03/2011   History of IBS 12/03/2011   GERD (gastroesophageal reflux disease) 12/03/2011   Family history of colonic polyps 12/03/2011   Severe obstructive sleep apnea 12/03/2011   History of mood disorder 12/03/2011   Past Medical History:  Diagnosis Date   Allergy    Asthma    Cataract    Depression    Family History  Problem Relation Age of Onset   Diabetes Mother    Arthritis Mother    Cancer Father    Cancer Brother        skin cancer   Past Surgical History:  Procedure Laterality Date   APPENDECTOMY     CATARACT EXTRACTION     KNEE SURGERY     Social History   Occupational History   Not on file  Tobacco Use   Smoking status: Never   Smokeless tobacco: Never  Substance and Sexual Activity   Alcohol use: Not on file   Drug use: No   Sexual activity: Yes

## 2022-10-07 ENCOUNTER — Encounter: Payer: Self-pay | Admitting: Sports Medicine

## 2022-10-07 ENCOUNTER — Ambulatory Visit (INDEPENDENT_AMBULATORY_CARE_PROVIDER_SITE_OTHER): Payer: Medicare Other | Admitting: Sports Medicine

## 2022-10-07 ENCOUNTER — Other Ambulatory Visit: Payer: Self-pay

## 2022-10-07 DIAGNOSIS — M722 Plantar fascial fibromatosis: Secondary | ICD-10-CM | POA: Diagnosis not present

## 2022-10-07 DIAGNOSIS — Z96641 Presence of right artificial hip joint: Secondary | ICD-10-CM

## 2022-10-07 DIAGNOSIS — M25551 Pain in right hip: Secondary | ICD-10-CM

## 2022-10-07 DIAGNOSIS — R252 Cramp and spasm: Secondary | ICD-10-CM

## 2022-10-07 MED ORDER — BETAMETHASONE SOD PHOS & ACET 6 (3-3) MG/ML IJ SUSP
6.0000 mg | INTRAMUSCULAR | Status: AC | PRN
  Administered 2022-10-07: 6 mg via INTRA_ARTICULAR

## 2022-10-07 MED ORDER — BUPIVACAINE HCL 0.25 % IJ SOLN
2.0000 mL | INTRAMUSCULAR | Status: AC | PRN
  Administered 2022-10-07: 2 mL

## 2022-10-07 NOTE — Progress Notes (Signed)
Cheryl Castillo - 67 y.o. female MRN [id number]  Date of birth: 12-Jan-1956  Office Visit Note: Visit Date: 10/07/2022 PCP: Cheryl Hazel, MD Referred by: Cheryl Hazel, MD  Subjective: Chief Complaint  Patient presents with   Right Hip - Follow-up   HPI: Cheryl Castillo is a pleasant 67 y.o. female who presents today for follow-up of right anterior hip pain, left plantar fasciitis.   Right anterior hip - history of prior THA over 3 years ago.  Have performed 2 sessions of extracorporeal shockwave therapy and she has been noticing rather good improvement and relief of her pain.  Less pain with flexing the hip.  She does take Celebrex 200 mg once daily as needed.  Left plantar fascia -recently has been very active on her feet and this certainly has flared up.  She did get good relief after the first shockwave therapy, although over the last week it is quite painful.  Noticing both pain as well as a burning sensation, pain with for steps.  Did try a night splint but this is uncomfortable for her.  Her cramps are much improved after taking tart cherry concentrate at my discretion twice daily  Pertinent ROS were reviewed with the patient and found to be negative unless otherwise specified above in HPI.   Assessment & Plan: Visit Diagnoses:  1. Plantar fasciitis of left foot   2. Muscle cramps   3. Pain in right hip   4. History of arthroplasty of right hip    Plan: Cheryl Castillo is dealing with an exacerbation of her chronic plantar fasciitis.  Ultrasound did show thickening of the insertional plantar fascia without high-grade tearing.  Initially had some response from shockwave therapy but her pain is currently worse and she needs to be feeling well to care for her husband and his upcoming surgery.  Through shared decision-making, did proceed with ultrasound-guided plantar fascia injection, patient tolerated well and had 100% relief of her pain minutes after the anesthetic portion.  Recommended ice,  stretching, towel scrunch, and [place] sock.  We did repeat extracorporeal shockwave therapy for her iliopsoas and right hip flexors as she has had good relief from this.  She will continue her therapy and HEP.  Okay for Celebrex 200 mg once daily as needed.  When she is done caring acutely for her husband, she can be seen for both of these in follow-up and we can consider repeat shockwave for the right iliopsoas and hip flexors if needed.  Follow-up: Return if symptoms worsen or fail to improve.   Meds & Orders: No orders of the defined types were placed in this encounter.   Orders Placed This Encounter  Procedures   [place] Extrem Low Left Ltd     Procedures: Procedure: ECSWT Indications:  Right Iliopsoas tendinopathy, Left plantar fascitis   Procedure Details Consent: Risks of procedure as well as the alternatives and risks of each were explained to the patient.  Written consent for procedure obtained. Time Out: Verified patient identification, verified procedure, site was marked, verified correct patient position, medications/allergies/relevent history reviewed.  The area was cleaned with alcohol swab.     The Right iliopsoas was targeted for Extracorporeal shockwave therapy.    Preset: Muscle Injury  Power Level: 120  Frequency: 14 Impulse/cycles: 2500 Head size: Regular      Clinical History: No specialty comments available.  She reports that she has never smoked. She has never used smokeless tobacco. No results for input(s): "HGBA1C", "LABURIC" in the  last 8760 hours.  Objective:   Vital Signs: There were no vitals taken for this visit.  Physical Exam  Gen: Well-appearing, in no acute distress; non-toxic CV:  Well-perfused. Warm.  Resp: Breathing unlabored on room air; no wheezing. Psych: Fluid speech in conversation; appropriate affect; normal thought process Neuro: Sensation intact throughout. No gross coordination deficits.   Ortho Exam - Right hip: No redness,  swelling, fluid hip movement without restriction.  - Left foot: + TTP over the medial aspect of the plantar heel near the plantar fascia insertion.  There is full range of motion with plantarflexion and dorsiflexion.  No redness swelling or effusion of the ankle.   Foot Inj: left plantar fascia  Date/Time: 10/07/2022 2:01 PM  Performed by: Cheryl Brunner, DO Authorized by: Cheryl Brunner, DO   Consent Given by:  Patient Site marked: the procedure site was marked   Timeout: prior to procedure the correct patient, procedure, and site was verified   Indications:  Fasciitis and pain Condition: Plantar Fasciitis   Location: left plantar fascia muscle   Prep: patient was prepped and draped in usual sterile fashion   Needle Size:  22 G Approach:  Plantar Medications:  2 mL bupivacaine 0.25 %; 6 mg betamethasone acetate-betamethasone sodium phosphate 6 (3-3) MG/ML Patient Tolerance:  Patient tolerated the procedure well with no immediate complications  *Technically successful ultrasound-guided plantar fascia injection   Imaging: [place] Extrem Low Left Ltd  Result Date: 10/07/2022 Limited musculoskeletal ultrasound of the left lower extremity, left foot and heel was performed today.  Evaluation of the calcaneus shows no significant cortical defect.  There is proper insertion of the plantar fascia over the calcaneus without evidence of high-grade tearing.  There is some thickening with a diameter at this location 0.51 cm.  Evaluating this in short and long axis there is a degree of hypoechoic fluid surrounding indicative of inflammation.  Overlying fat pad seen without abnormality. *Technically successful ultrasound-guided plantar fascia injection   Past Medical/Family/Surgical/Social History: Medications & Allergies reviewed per EMR, new medications updated. Patient Active Problem List   Diagnosis Date Noted   OSA on CPAP 07/18/2015   Cataract 07/27/2013   Thyroid nodule  bx done 06/2013 07/05/2013    Osteopenia 05/08/2013   Menopause 05/08/2013   Allergic rhinitis with asthma without status asthmaticus 12/03/2011   Hx of bacterial pneumonia 12/03/2011   History of IBS 12/03/2011   GERD (gastroesophageal reflux disease) 12/03/2011   Family history of colonic polyps 12/03/2011   Severe obstructive sleep apnea 12/03/2011   History of mood disorder 12/03/2011   Past Medical History:  Diagnosis Date   Allergy    Asthma    Cataract    Depression    Family History  Problem Relation Age of Onset   Diabetes Mother    Arthritis Mother    Cancer Father    Cancer Brother        skin cancer   Past Surgical History:  Procedure Laterality Date   APPENDECTOMY     CATARACT EXTRACTION     KNEE SURGERY     Social History   Occupational History   Not on file  Tobacco Use   Smoking status: Never   Smokeless tobacco: Never  Substance and Sexual Activity   Alcohol use: Not on file   Drug use: No   Sexual activity: Yes

## 2022-11-18 ENCOUNTER — Encounter: Payer: Self-pay | Admitting: Sports Medicine

## 2022-11-24 ENCOUNTER — Encounter: Payer: Self-pay | Admitting: Podiatry

## 2022-11-24 ENCOUNTER — Ambulatory Visit (INDEPENDENT_AMBULATORY_CARE_PROVIDER_SITE_OTHER): Payer: Medicare Other | Admitting: Podiatry

## 2022-11-24 VITALS — BP 118/69 | HR 72 | Resp 18 | Ht 63.0 in | Wt 156.0 lb

## 2022-11-24 DIAGNOSIS — Q666 Other congenital valgus deformities of feet: Secondary | ICD-10-CM

## 2022-11-24 DIAGNOSIS — M722 Plantar fascial fibromatosis: Secondary | ICD-10-CM | POA: Diagnosis not present

## 2022-11-24 NOTE — Progress Notes (Signed)
Subjective:   Patient ID: Cheryl Castillo, female   DOB: 67 y.o.   MRN: [id number]   HPI Chief Complaint  Patient presents with   Foot Pain    Left heal pain   67 year old female presents the office today with concerns of ongoing left heel pain.  She has previously been treated by orthopedics with Dr. Madelyn Brunner and she has had shockwave treatment as well as a steroid injection.  She has a pair of orthotics which do help some with her 67 years old and she was recommended get new inserts.  Denies any recent injuries.  Pain is localized more to the bottom of the heel.  She is an injection took about 80% of the severe pain away but she is not under percent better.  She did purchase an over-the-counter night splint which was not comfortable.   Review of Systems  All other systems reviewed and are negative.  Past Medical History:  Diagnosis Date   Allergy    Asthma    Cataract    Depression     Past Surgical History:  Procedure Laterality Date   APPENDECTOMY     CATARACT EXTRACTION     KNEE SURGERY       Current Outpatient Medications:    albuterol (PROVENTIL HFA;VENTOLIN HFA) 108 (90 Base) MCG/ACT inhaler, Inhale 2 puffs into the lungs every 6 (six) hours as needed., Disp: 1 Inhaler, Rfl: 2   amoxicillin-clavulanate (AUGMENTIN) 500-125 MG tablet, SMARTSIG:1 Tablet(s) By Mouth Every 12 Hours, Disp: , Rfl:    aspirin 81 MG chewable tablet, Chew by mouth daily., Disp: , Rfl:    b complex vitamins tablet, Take 1 tablet by mouth daily., Disp: , Rfl:    budesonide-formoterol (SYMBICORT) 160-4.5 MCG/ACT inhaler, Inhale 2 puffs into the lungs 2 (two) times daily., Disp: 1 Inhaler, Rfl: 3   Calcium-Phosphorus-Vitamin D (CALCIUM GUMMIES PO), Take 2 tablets by mouth., Disp: , Rfl:    celecoxib (CELEBREX) 100 MG capsule, Take 1 capsule (100 mg total) by mouth 2 (two) times daily. (Patient taking differently: Take 100 mg by mouth as needed.), Disp: 30 capsule, Rfl: 0   cholecalciferol (VITAMIN  D) 1000 units tablet, Take 5,000 Units by mouth daily., Disp: , Rfl:    cycloSPORINE (RESTASIS) 0.05 % ophthalmic emulsion, 1 drop 2 (two) times daily., Disp: , Rfl:    estradiol-norethindrone (COMBIPATCH) 0.05-0.14 MG/DAY, Place 1 patch onto the skin once as needed. 1/2 patch once weekly, Disp: , Rfl:    estrogens, conjugated, (PREMARIN) 0.625 MG tablet, Take 0.625 mg by mouth 2 (two) times a week. Using 2-3 times weekly, Disp: , Rfl:    famotidine (PEPCID) 20 MG tablet, Take 20 mg by mouth 2 (two) times daily., Disp: , Rfl:    ivabradine (CORLANOR) 5 MG TABS tablet, Take 2 tablets (10 mg) by mouth 2 hours prior to CCTA scan, Disp: 2 tablet, Rfl: 0   metoprolol tartrate (LOPRESSOR) 50 MG tablet, Take 1 tablet (50 mg total) by mouth once for 1 dose. PLEASE TAKE METOPROLOL 2  HOURS PRIOR TO CTA SCAN., Disp: 1 tablet, Rfl: 0   MODERNA COVID-19 VACCINE 100 MCG/0.5ML injection, , Disp: , Rfl:    Multiple Vitamin (MULTIVITAMIN WITH MINERALS) TABS tablet, Take 1 tablet by mouth daily., Disp: , Rfl:    PRESCRIPTION MEDICATION, 0.5 patches. Combi Patch 50/140 change patch twice weekly, Disp: , Rfl:   Allergies  Allergen Reactions   Levaquin [Levofloxacin]     Joint Pain  Objective:  Physical Exam  General: AAO x3, NAD  Dermatological: Skin is warm, dry and supple bilateral. There are no open sores, no preulcerative lesions, no rash or signs of infection present.  Vascular: Dorsalis Pedis artery and Posterior Tibial artery pedal pulses are 2/4 bilateral with immedate capillary fill time.There is no pain with calf compression, swelling, warmth, erythema.   Neruologic: Grossly intact via light touch bilateral.  Musculoskeletal: She has tenderness palpation along the plantar, plantar lateral aspect the heel and insertion of plantar fascia.  There is no pain with lateral compression of the calcaneus.  No pain on the Achilles tendon.  Flexor, extensor tendons appear to be intact.  Decreased  arch height.  MMT 5/5  Gait: Unassisted, Nonantalgic.       Assessment:   Plantar fasciitis     Plan:  -Treatment options discussed including all alternatives, risks, and complications -Etiology of symptoms were discussed -Previously she is attempted other conservative options.  Discussed repeat steroid injection in the future if needed.  We discussed the importance of stretching, icing on a regular basis.  I dispensed a new night splint to help support the plantar fashion help stretch this as well. Static or dynamic ankle foot orthosis, including soft interface material, adjustable for fit, for positioning, may be used for minimal ambulation, prefabricated, off-the-shelf was dispensed on the billed date of service.  -She was measured for new orthotics today. -Referral to PT  Vivi Barrack DPM

## 2022-11-24 NOTE — Patient Instructions (Signed)
.mw  Plantar Fasciitis (Heel Spur Syndrome) with Rehab The plantar fascia is a fibrous, ligament-like, soft-tissue structure that spans the bottom of the foot. Plantar fasciitis is a condition that causes pain in the foot due to inflammation of the tissue. SYMPTOMS   Pain and tenderness on the underneath side of the foot.  Pain that worsens with standing or walking. CAUSES  Plantar fasciitis is caused by irritation and injury to the plantar fascia on the underneath side of the foot. Common mechanisms of injury include:  Direct trauma to bottom of the foot.  Damage to a small nerve that runs under the foot where the main fascia attaches to the heel bone.  Stress placed on the plantar fascia due to bone spurs. RISK INCREASES WITH:   Activities that place stress on the plantar fascia (running, jumping, pivoting, or cutting).  Poor strength and flexibility.  Improperly fitted shoes.  Tight calf muscles.  Flat feet.  Failure to warm-up properly before activity.  Obesity. PREVENTION  Warm up and stretch properly before activity.  Allow for adequate recovery between workouts.  Maintain physical fitness:  Strength, flexibility, and endurance.  Cardiovascular fitness.  Maintain a health body weight.  Avoid stress on the plantar fascia.  Wear properly fitted shoes, including arch supports for individuals who have flat feet.  PROGNOSIS  If treated properly, then the symptoms of plantar fasciitis usually resolve without surgery. However, occasionally surgery is necessary.  RELATED COMPLICATIONS   Recurrent symptoms that may result in a chronic condition.  Problems of the lower back that are caused by compensating for the injury, such as limping.  Pain or weakness of the foot during push-off following surgery.  Chronic inflammation, scarring, and partial or complete fascia tear, occurring more often from repeated injections.  TREATMENT  Treatment initially involves  the use of ice and medication to help reduce pain and inflammation. The use of strengthening and stretching exercises may help reduce pain with activity, especially stretches of the Achilles tendon. These exercises may be performed at home or with a therapist. Your caregiver may recommend that you use heel cups of arch supports to help reduce stress on the plantar fascia. Occasionally, corticosteroid injections are given to reduce inflammation. If symptoms persist for greater than 6 months despite non-surgical (conservative), then surgery may be recommended.   MEDICATION   If pain medication is necessary, then nonsteroidal anti-inflammatory medications, such as aspirin and ibuprofen, or other minor pain relievers, such as acetaminophen, are often recommended.  Do not take pain medication within 7 days before surgery.  Prescription pain relievers may be given if deemed necessary by your caregiver. Use only as directed and only as much as you need.  Corticosteroid injections may be given by your caregiver. These injections should be reserved for the most serious cases, because they may only be given a certain number of times.  HEAT AND COLD  Cold treatment (icing) relieves pain and reduces inflammation. Cold treatment should be applied for 10 to 15 minutes every 2 to 3 hours for inflammation and pain and immediately after any activity that aggravates your symptoms. Use ice packs or massage the area with a piece of ice (ice massage).  Heat treatment may be used prior to performing the stretching and strengthening activities prescribed by your caregiver, physical therapist, or athletic trainer. Use a heat pack or soak the injury in warm water.  SEEK IMMEDIATE MEDICAL CARE IF:  Treatment seems to offer no benefit, or the condition worsens.  Any medications produce adverse side effects.  EXERCISES- RANGE OF MOTION (ROM) AND STRETCHING EXERCISES - Plantar Fasciitis (Heel Spur Syndrome) These  exercises may help you when beginning to rehabilitate your injury. Your symptoms may resolve with or without further involvement from your physician, physical therapist or athletic trainer. While completing these exercises, remember:   Restoring tissue flexibility helps normal motion to return to the joints. This allows healthier, less painful movement and activity.  An effective stretch should be held for at least 30 seconds.  A stretch should never be painful. You should only feel a gentle lengthening or release in the stretched tissue.  RANGE OF MOTION - Toe Extension, Flexion  Sit with your right / left leg crossed over your opposite knee.  Grasp your toes and gently pull them back toward the top of your foot. You should feel a stretch on the bottom of your toes and/or foot.  Hold this stretch for 10 seconds.  Now, gently pull your toes toward the bottom of your foot. You should feel a stretch on the top of your toes and or foot.  Hold this stretch for 10 seconds. Repeat  times. Complete this stretch 3 times per day.   RANGE OF MOTION - Ankle Dorsiflexion, Active Assisted  Remove shoes and sit on a chair that is preferably not on a carpeted surface.  Place right / left foot under knee. Extend your opposite leg for support.  Keeping your heel down, slide your right / left foot back toward the chair until you feel a stretch at your ankle or calf. If you do not feel a stretch, slide your bottom forward to the edge of the chair, while still keeping your heel down.  Hold this stretch for 10 seconds. Repeat 3 times. Complete this stretch 2 times per day.   STRETCH  Gastroc, Standing  Place hands on wall.  Extend right / left leg, keeping the front knee somewhat bent.  Slightly point your toes inward on your back foot.  Keeping your right / left heel on the floor and your knee straight, shift your weight toward the wall, not allowing your back to arch.  You should feel a gentle  stretch in the right / left calf. Hold this position for 10 seconds. Repeat 3 times. Complete this stretch 2 times per day.  STRETCH  Soleus, Standing  Place hands on wall.  Extend right / left leg, keeping the other knee somewhat bent.  Slightly point your toes inward on your back foot.  Keep your right / left heel on the floor, bend your back knee, and slightly shift your weight over the back leg so that you feel a gentle stretch deep in your back calf.  Hold this position for 10 seconds. Repeat 3 times. Complete this stretch 2 times per day.  STRETCH  Gastrocsoleus, Standing  Note: This exercise can place a lot of stress on your foot and ankle. Please complete this exercise only if specifically instructed by your caregiver.   Place the ball of your right / left foot on a step, keeping your other foot firmly on the same step.  Hold on to the wall or a rail for balance.  Slowly lift your other foot, allowing your body weight to press your heel down over the edge of the step.  You should feel a stretch in your right / left calf.  Hold this position for 10 seconds.  Repeat this exercise with a slight bend in your  right / left knee. Repeat 3 times. Complete this stretch 2 times per day.   STRENGTHENING EXERCISES - Plantar Fasciitis (Heel Spur Syndrome)  These exercises may help you when beginning to rehabilitate your injury. They may resolve your symptoms with or without further involvement from your physician, physical therapist or athletic trainer. While completing these exercises, remember:   Muscles can gain both the endurance and the strength needed for everyday activities through controlled exercises.  Complete these exercises as instructed by your physician, physical therapist or athletic trainer. Progress the resistance and repetitions only as guided.  STRENGTH - Towel Curls  Sit in a chair positioned on a non-carpeted surface.  Place your foot on a towel, keeping  your heel on the floor.  Pull the towel toward your heel by only curling your toes. Keep your heel on the floor. Repeat 3 times. Complete this exercise 2 times per day.  STRENGTH - Ankle Inversion  Secure one end of a rubber exercise band/tubing to a fixed object (table, pole). Loop the other end around your foot just before your toes.  Place your fists between your knees. This will focus your strengthening at your ankle.  Slowly, pull your big toe up and in, making sure the band/tubing is positioned to resist the entire motion.  Hold this position for 10 seconds.  Have your muscles resist the band/tubing as it slowly pulls your foot back to the starting position. Repeat 3 times. Complete this exercises 2 times per day.  Document Released: 02/02/2005 Document Revised: 04/27/2011 Document Reviewed: 05/17/2008 [facility] Patient Information 2014 [place], [place].

## 2023-06-18 ENCOUNTER — Other Ambulatory Visit: Payer: Self-pay | Admitting: Family Medicine

## 2023-06-18 DIAGNOSIS — R293 Abnormal posture: Secondary | ICD-10-CM

## 2023-07-13 ENCOUNTER — Ambulatory Visit
Admission: RE | Admit: 2023-07-13 | Discharge: 2023-07-13 | Disposition: A | Discharge status: Home / Self Care | Source: Ambulatory Visit | Attending: Family Medicine | Admitting: Family Medicine

## 2023-07-13 DIAGNOSIS — R293 Abnormal posture: Secondary | ICD-10-CM

## 2023-07-13 MED ORDER — GADOPICLENOL 0.5 MMOL/ML IV SOLN
7.5000 mL | Freq: Once | INTRAVENOUS | Status: AC | PRN
  Administered 2023-07-13: 7.5 mL via INTRAVENOUS

## 2023-07-16 ENCOUNTER — Other Ambulatory Visit: Payer: Self-pay | Admitting: Family Medicine

## 2023-07-16 DIAGNOSIS — R293 Abnormal posture: Secondary | ICD-10-CM

## 2023-07-24 ENCOUNTER — Ambulatory Visit
Admission: RE | Admit: 2023-07-24 | Discharge: 2023-07-24 | Disposition: A | Discharge status: Home / Self Care | Source: Ambulatory Visit | Attending: Family Medicine | Admitting: Family Medicine

## 2023-07-24 DIAGNOSIS — R293 Abnormal posture: Secondary | ICD-10-CM

## 2023-10-27 ENCOUNTER — Encounter: Payer: Self-pay | Admitting: Internal Medicine

## 2023-10-27 ENCOUNTER — Other Ambulatory Visit: Payer: Self-pay

## 2023-10-27 ENCOUNTER — Ambulatory Visit: Attending: Internal Medicine | Admitting: Internal Medicine

## 2023-10-27 VITALS — BP 100/64 | HR 68 | Ht 63.0 in | Wt 156.0 lb

## 2023-10-27 DIAGNOSIS — R9431 Abnormal electrocardiogram [ECG] [EKG]: Secondary | ICD-10-CM

## 2023-10-27 DIAGNOSIS — R002 Palpitations: Secondary | ICD-10-CM

## 2023-10-27 DIAGNOSIS — I493 Ventricular premature depolarization: Secondary | ICD-10-CM | POA: Diagnosis present

## 2023-10-27 DIAGNOSIS — G4733 Obstructive sleep apnea (adult) (pediatric): Secondary | ICD-10-CM | POA: Diagnosis not present

## 2023-10-27 DIAGNOSIS — E7849 Other hyperlipidemia: Secondary | ICD-10-CM

## 2023-10-27 NOTE — Patient Instructions (Signed)
 Medication Instructions:  Your physician recommends that you continue on your current medications as directed. Please refer to the Current Medication list given to you today.  *If you need a refill on your cardiac medications before your next appointment, please call your pharmacy*  Lab Work: Fasting Lipids and LpA - please come back to [facility] on the first floor anytime between 8:00am and 4:30am to have these drawn  Testing/Procedures: Echocardiogram  Your physician has requested that you have an echocardiogram. Echocardiography is a painless test that uses sound waves to create images of your heart. It provides your doctor with information about the size and shape of your heart and how well your heart's chambers and valves are working. This procedure takes approximately one hour. There are no restrictions for this procedure. Please do NOT wear cologne, perfume, aftershave, or lotions (deodorant is allowed). Please arrive 15 minutes prior to your appointment time.  Please note: We ask at that you not bring children with you during ultrasound (echo/ vascular) testing. Due to room size and safety concerns, children are not allowed in the ultrasound rooms during exams. Our front office staff cannot provide observation of children in our lobby area while testing is being conducted. An adult accompanying a patient to their appointment will only be allowed in the ultrasound room at the discretion of the ultrasound technician under special circumstances. We apologize for any inconvenience.  Follow-Up: At [facility], you and your health needs are our priority.  As part of our continuing mission to provide you with exceptional heart care, our providers are all part of one team.  This team includes your primary Cardiologist (physician) and Advanced Practice Providers or APPs (Physician Assistants and Nurse Practitioners) who all work together to provide you with the care you need, when you need  it.  Your next appointment:   1 year  Provider:   Gayatri A Acharya, MD

## 2023-10-27 NOTE — Progress Notes (Signed)
 Cardiology Office Note:  .   Date:  10/27/2023  ID:  Cheryl Castillo, DOB 24-Jan-1956, MRN [id number] PCP: Cleotilde Planas, MD  [facility] Providers Cardiologist:  Soyla DELENA Merck, MD    History of Present Illness: .   Cheryl Castillo is a 68 y.o. female.  Discussed the use of AI scribe software for clinical note transcription with the patient, who gave verbal consent to proceed.  History of Present Illness Cheryl Castillo is a 68 year old female who presents with concerns about palpitations. She is accompanied by Reyes, her husband, also my patient.  She experiences palpitations described as skipped beats while sitting. She noticed an erratic heartbeat when feeling her husband's pulse, describing it as every second or third beat missing. These symptoms have improved recently. She denies current chest pain or shortness of breath. A coronary CTA in 2022 showed normal coronary arteries with no plaque or calcium.  She consumes minimal caffeine, approximately one ounce, and avoids chocolate due to caffeine sensitivity. She ensures adequate hydration and electrolyte intake during physical activities, consuming Gatorade with extra electrolytes and tart cherry juice for cramp relief.    ROS: negative except per HPI above.  Studies Reviewed: SABRA   EKG Interpretation Date/Time:  Wednesday October 27 2023 08:33:54 EDT Ventricular Rate:  68 PR Interval:  126 QRS Duration:  78 QT Interval:  412 QTC Calculation: 438 R Axis:   6  Text Interpretation: Normal sinus rhythm Low voltage QRS Cannot rule out Anterior infarct , age undetermined When compared with ECG of 30-Oct-2020 13:42, Premature atrial complexes are no longer Present Confirmed by Merck Soyla ([id number]) on 10/27/2023 8:52:22 AM    Results RADIOLOGY Coronary CTA: Normal coronary arteries with no plaque or calcium (2022)  DIAGNOSTIC EKG: Abnormality suggesting old myocardial infarction, likely artifact (10/27/2023) EKG:  Normal except one premature atrial contraction (2022) Risk Assessment/Calculations:       Physical Exam:   VS:  BP 100/64   Pulse 68   Ht 5' 3 (1.6 m)   Wt 156 lb (70.8 kg)   SpO2 98%   BMI 27.63 kg/m    Wt Readings from Last 3 Encounters:  10/27/23 156 lb (70.8 kg)  11/24/22 156 lb (70.8 kg)  11/01/20 162 lb 9.6 oz (73.8 kg)     Physical Exam GENERAL: Alert, cooperative, well developed, no acute distress. HEENT: Normocephalic, normal oropharynx, moist mucous membranes. CHEST: Clear to auscultation bilaterally, no wheezes, rhonchi, or crackles. CARDIOVASCULAR: Regular rate and rhythm, S1 and S2 normal without murmurs, no skipped beats or palpitations. ABDOMEN: Soft, non-tender, non-distended, without organomegaly, normal bowel sounds. EXTREMITIES: No cyanosis or edema. NEUROLOGICAL: Cranial nerves grossly intact, moves all extremities without gross motor or sensory deficit.   ASSESSMENT AND PLAN: .    Assessment and Plan Assessment & Plan Palpitations Abnormal EKG Intermittent palpitations. EKG shows minor abnormality suggestive of old myocardial infarction, likely an artifact. Previous EKGs normal except one PAC.  - Order echocardiogram to evaluate cardiac structure and function in light of change in symptoms and abnormal EKG. - Discussed caffeine sensitivity and advised monitoring caffeine intake.  Obstructive sleep apnea Well-managed with CPAP therapy. No issues reported.  Hyperlipidemia Cholesterol levels not recently checked. Previous coronary CTA showed no plaque or calcium. Discussed importance of monitoring cholesterol levels to prevent cardiovascular risk. Family history suggests potential genetic risk factors. Discussed lipoprotein A as a risk marker. - Order fasting lipid panel and lipoprotein A test at next fasting opportunity. -  Monitor cholesterol levels and adjust management based on results.      Soyla Merck, MD, FACC

## 2023-11-10 LAB — LIPID PANEL
Chol/HDL Ratio: 3.5 ratio (ref 0.0–4.4)
Cholesterol, Total: 195 mg/dL (ref 100–199)
HDL: 56 mg/dL (ref >39)
LDL Chol Calc (NIH): 114 mg/dL — ABNORMAL HIGH (ref 0–99)
Triglycerides: 140 mg/dL (ref 0–149)
VLDL Cholesterol Cal: 25 mg/dL (ref 5–40)

## 2023-11-10 LAB — LIPOPROTEIN A (LPA): Lipoprotein (a): <8.4 nmol/L (ref <75.0)

## 2023-11-25 ENCOUNTER — Ambulatory Visit: Payer: Self-pay | Admitting: Internal Medicine

## 2023-12-06 ENCOUNTER — Ambulatory Visit (HOSPITAL_COMMUNITY)
Admission: RE | Admit: 2023-12-06 | Discharge: 2023-12-06 | Disposition: A | Discharge status: Home / Self Care | Source: Ambulatory Visit | Attending: Cardiology | Admitting: Cardiology

## 2023-12-06 DIAGNOSIS — R9431 Abnormal electrocardiogram [ECG] [EKG]: Secondary | ICD-10-CM | POA: Insufficient documentation

## 2023-12-06 LAB — ECHOCARDIOGRAM COMPLETE
Area-P 1/2: 3.62 cm2
S' Lateral: 2.4 cm
